# Patient Record
Sex: Female | Born: 1945
Health system: Southern US, Community
[De-identification: ages and names within clinical notes are randomized; demographics above are authoritative.]

## PROBLEM LIST (undated history)

## (undated) DIAGNOSIS — K219 Gastro-esophageal reflux disease without esophagitis: Secondary | ICD-10-CM

## (undated) DIAGNOSIS — E079 Disorder of thyroid, unspecified: Secondary | ICD-10-CM

## (undated) DIAGNOSIS — S82899A Other fracture of unspecified lower leg, initial encounter for closed fracture: Secondary | ICD-10-CM

## (undated) DIAGNOSIS — H269 Unspecified cataract: Secondary | ICD-10-CM

## (undated) DIAGNOSIS — M7731 Calcaneal spur, right foot: Secondary | ICD-10-CM

## (undated) DIAGNOSIS — E559 Vitamin D deficiency, unspecified: Secondary | ICD-10-CM

## (undated) DIAGNOSIS — M8580 Other specified disorders of bone density and structure, unspecified site: Secondary | ICD-10-CM

## (undated) HISTORY — DX: Vitamin D deficiency, unspecified: E55.9

## (undated) HISTORY — DX: Calcaneal spur, right foot: M77.31

## (undated) HISTORY — DX: Gastro-esophageal reflux disease without esophagitis: K21.9

## (undated) HISTORY — DX: Disorder of thyroid, unspecified: E07.9

## (undated) HISTORY — PX: ABDOMINAL HYSTERECTOMY: SHX81

## (undated) HISTORY — DX: Unspecified cataract: H26.9

## (undated) HISTORY — PX: CATARACT EXTRACTION, BILATERAL: SHX1313

## (undated) HISTORY — DX: Other fracture of unspecified lower leg, initial encounter for closed fracture: S82.899A

## (undated) HISTORY — DX: Other specified disorders of bone density and structure, unspecified site: M85.80

## (undated) HISTORY — PX: SPINE SURGERY: SHX786

---

## 2005-03-29 ENCOUNTER — Ambulatory Visit: Payer: Self-pay | Admitting: Family Medicine

## 2005-04-01 ENCOUNTER — Ambulatory Visit: Payer: Self-pay | Admitting: Family Medicine

## 2005-04-04 ENCOUNTER — Ambulatory Visit: Payer: Self-pay | Admitting: Family Medicine

## 2005-04-08 ENCOUNTER — Ambulatory Visit: Payer: Self-pay | Admitting: Family Medicine

## 2005-04-12 ENCOUNTER — Ambulatory Visit: Payer: Self-pay | Admitting: Family Medicine

## 2005-04-23 ENCOUNTER — Ambulatory Visit: Payer: Self-pay | Admitting: Family Medicine

## 2016-10-02 DIAGNOSIS — H2513 Age-related nuclear cataract, bilateral: Secondary | ICD-10-CM | POA: Diagnosis not present

## 2016-10-08 DIAGNOSIS — H2513 Age-related nuclear cataract, bilateral: Secondary | ICD-10-CM | POA: Diagnosis not present

## 2016-11-07 DIAGNOSIS — Z961 Presence of intraocular lens: Secondary | ICD-10-CM | POA: Diagnosis not present

## 2016-11-07 DIAGNOSIS — H2512 Age-related nuclear cataract, left eye: Secondary | ICD-10-CM | POA: Diagnosis not present

## 2016-11-07 DIAGNOSIS — H2511 Age-related nuclear cataract, right eye: Secondary | ICD-10-CM | POA: Diagnosis not present

## 2017-02-19 DIAGNOSIS — H2512 Age-related nuclear cataract, left eye: Secondary | ICD-10-CM | POA: Diagnosis not present

## 2017-02-19 DIAGNOSIS — H04123 Dry eye syndrome of bilateral lacrimal glands: Secondary | ICD-10-CM | POA: Diagnosis not present

## 2017-02-19 DIAGNOSIS — Z961 Presence of intraocular lens: Secondary | ICD-10-CM | POA: Diagnosis not present

## 2017-02-27 DIAGNOSIS — H2512 Age-related nuclear cataract, left eye: Secondary | ICD-10-CM | POA: Diagnosis not present

## 2017-02-27 DIAGNOSIS — Z961 Presence of intraocular lens: Secondary | ICD-10-CM | POA: Diagnosis not present

## 2017-02-27 DIAGNOSIS — H20012 Primary iridocyclitis, left eye: Secondary | ICD-10-CM | POA: Diagnosis not present

## 2017-02-27 DIAGNOSIS — H18232 Secondary corneal edema, left eye: Secondary | ICD-10-CM | POA: Diagnosis not present

## 2017-04-01 DIAGNOSIS — H59022 Cataract (lens) fragments in eye following cataract surgery, left eye: Secondary | ICD-10-CM | POA: Diagnosis not present

## 2017-05-06 DIAGNOSIS — E785 Hyperlipidemia, unspecified: Secondary | ICD-10-CM | POA: Insufficient documentation

## 2017-05-06 DIAGNOSIS — E039 Hypothyroidism, unspecified: Secondary | ICD-10-CM | POA: Insufficient documentation

## 2017-05-06 DIAGNOSIS — R319 Hematuria, unspecified: Secondary | ICD-10-CM | POA: Diagnosis not present

## 2017-05-06 DIAGNOSIS — N390 Urinary tract infection, site not specified: Secondary | ICD-10-CM | POA: Diagnosis not present

## 2017-05-06 DIAGNOSIS — E559 Vitamin D deficiency, unspecified: Secondary | ICD-10-CM | POA: Diagnosis not present

## 2017-05-06 DIAGNOSIS — E669 Obesity, unspecified: Secondary | ICD-10-CM | POA: Diagnosis not present

## 2017-05-07 DIAGNOSIS — E039 Hypothyroidism, unspecified: Secondary | ICD-10-CM | POA: Diagnosis not present

## 2017-05-07 DIAGNOSIS — E559 Vitamin D deficiency, unspecified: Secondary | ICD-10-CM | POA: Diagnosis not present

## 2017-05-07 DIAGNOSIS — E785 Hyperlipidemia, unspecified: Secondary | ICD-10-CM | POA: Diagnosis not present

## 2017-05-07 DIAGNOSIS — E669 Obesity, unspecified: Secondary | ICD-10-CM | POA: Diagnosis not present

## 2017-06-13 DIAGNOSIS — H04123 Dry eye syndrome of bilateral lacrimal glands: Secondary | ICD-10-CM | POA: Diagnosis not present

## 2017-07-07 DIAGNOSIS — E559 Vitamin D deficiency, unspecified: Secondary | ICD-10-CM | POA: Diagnosis not present

## 2017-07-07 DIAGNOSIS — E039 Hypothyroidism, unspecified: Secondary | ICD-10-CM | POA: Diagnosis not present

## 2017-07-23 DIAGNOSIS — R87610 Atypical squamous cells of undetermined significance on cytologic smear of cervix (ASC-US): Secondary | ICD-10-CM | POA: Diagnosis not present

## 2017-07-23 DIAGNOSIS — B373 Candidiasis of vulva and vagina: Secondary | ICD-10-CM | POA: Diagnosis not present

## 2017-07-23 DIAGNOSIS — N898 Other specified noninflammatory disorders of vagina: Secondary | ICD-10-CM | POA: Diagnosis not present

## 2017-09-18 DIAGNOSIS — H04123 Dry eye syndrome of bilateral lacrimal glands: Secondary | ICD-10-CM | POA: Diagnosis not present

## 2017-10-14 DIAGNOSIS — E785 Hyperlipidemia, unspecified: Secondary | ICD-10-CM | POA: Diagnosis not present

## 2017-10-14 DIAGNOSIS — R5383 Other fatigue: Secondary | ICD-10-CM | POA: Diagnosis not present

## 2017-10-14 DIAGNOSIS — R799 Abnormal finding of blood chemistry, unspecified: Secondary | ICD-10-CM | POA: Diagnosis not present

## 2017-10-14 DIAGNOSIS — E119 Type 2 diabetes mellitus without complications: Secondary | ICD-10-CM | POA: Diagnosis not present

## 2017-10-20 DIAGNOSIS — E039 Hypothyroidism, unspecified: Secondary | ICD-10-CM | POA: Diagnosis not present

## 2018-01-01 DIAGNOSIS — E039 Hypothyroidism, unspecified: Secondary | ICD-10-CM | POA: Diagnosis not present

## 2018-03-04 DIAGNOSIS — E039 Hypothyroidism, unspecified: Secondary | ICD-10-CM | POA: Diagnosis not present

## 2018-03-25 DIAGNOSIS — N390 Urinary tract infection, site not specified: Secondary | ICD-10-CM | POA: Diagnosis not present

## 2018-03-25 DIAGNOSIS — R350 Frequency of micturition: Secondary | ICD-10-CM | POA: Diagnosis not present

## 2018-03-25 DIAGNOSIS — Z1231 Encounter for screening mammogram for malignant neoplasm of breast: Secondary | ICD-10-CM | POA: Diagnosis not present

## 2018-03-25 DIAGNOSIS — R319 Hematuria, unspecified: Secondary | ICD-10-CM | POA: Diagnosis not present

## 2018-09-10 DIAGNOSIS — H6500 Acute serous otitis media, unspecified ear: Secondary | ICD-10-CM | POA: Diagnosis not present

## 2018-10-01 DIAGNOSIS — H04123 Dry eye syndrome of bilateral lacrimal glands: Secondary | ICD-10-CM | POA: Diagnosis not present

## 2019-06-09 DIAGNOSIS — E559 Vitamin D deficiency, unspecified: Secondary | ICD-10-CM | POA: Diagnosis not present

## 2019-06-09 DIAGNOSIS — E039 Hypothyroidism, unspecified: Secondary | ICD-10-CM | POA: Diagnosis not present

## 2019-06-09 DIAGNOSIS — E669 Obesity, unspecified: Secondary | ICD-10-CM | POA: Diagnosis not present

## 2019-06-09 DIAGNOSIS — E785 Hyperlipidemia, unspecified: Secondary | ICD-10-CM | POA: Diagnosis not present

## 2019-06-10 DIAGNOSIS — E039 Hypothyroidism, unspecified: Secondary | ICD-10-CM | POA: Diagnosis not present

## 2019-06-10 DIAGNOSIS — E669 Obesity, unspecified: Secondary | ICD-10-CM | POA: Diagnosis not present

## 2019-06-10 DIAGNOSIS — H9203 Otalgia, bilateral: Secondary | ICD-10-CM | POA: Diagnosis not present

## 2019-06-10 DIAGNOSIS — K219 Gastro-esophageal reflux disease without esophagitis: Secondary | ICD-10-CM | POA: Insufficient documentation

## 2019-06-10 DIAGNOSIS — E785 Hyperlipidemia, unspecified: Secondary | ICD-10-CM | POA: Diagnosis not present

## 2019-06-10 DIAGNOSIS — R590 Localized enlarged lymph nodes: Secondary | ICD-10-CM | POA: Diagnosis not present

## 2019-06-10 DIAGNOSIS — E559 Vitamin D deficiency, unspecified: Secondary | ICD-10-CM | POA: Diagnosis not present

## 2019-11-02 ENCOUNTER — Encounter: Payer: Self-pay | Admitting: Physician Assistant

## 2019-11-02 ENCOUNTER — Other Ambulatory Visit: Payer: Self-pay

## 2019-11-02 ENCOUNTER — Ambulatory Visit (INDEPENDENT_AMBULATORY_CARE_PROVIDER_SITE_OTHER): Payer: Medicare HMO | Admitting: Physician Assistant

## 2019-11-02 VITALS — BP 116/72 | HR 73 | Temp 98.7°F | Ht 64.0 in | Wt 184.4 lb

## 2019-11-02 DIAGNOSIS — L659 Nonscarring hair loss, unspecified: Secondary | ICD-10-CM | POA: Diagnosis not present

## 2019-11-02 DIAGNOSIS — E039 Hypothyroidism, unspecified: Secondary | ICD-10-CM

## 2019-11-02 DIAGNOSIS — E785 Hyperlipidemia, unspecified: Secondary | ICD-10-CM | POA: Diagnosis not present

## 2019-11-02 DIAGNOSIS — Z0000 Encounter for general adult medical examination without abnormal findings: Secondary | ICD-10-CM | POA: Diagnosis not present

## 2019-11-02 DIAGNOSIS — K219 Gastro-esophageal reflux disease without esophagitis: Secondary | ICD-10-CM

## 2019-11-02 DIAGNOSIS — M67971 Unspecified disorder of synovium and tendon, right ankle and foot: Secondary | ICD-10-CM | POA: Diagnosis not present

## 2019-11-02 DIAGNOSIS — E559 Vitamin D deficiency, unspecified: Secondary | ICD-10-CM | POA: Diagnosis not present

## 2019-11-02 MED ORDER — OMEPRAZOLE 10 MG PO CPDR: 10.0000 mg | DELAYED_RELEASE_CAPSULE | Freq: Every day | ORAL | 3 refills | Status: DC

## 2019-11-02 MED ORDER — VITAMIN D (ERGOCALCIFEROL) 1.25 MG (50000 UNIT) PO CAPS: 50000.0000 [IU] | ORAL_CAPSULE | ORAL | 11 refills | Status: DC

## 2019-11-02 MED ORDER — EUTHYROX 88 MCG PO TABS: 88.0000 ug | ORAL_TABLET | Freq: Every day | ORAL | 1 refills | Status: DC

## 2019-11-03 LAB — CMP14+EGFR
ALT: 14 IU/L (ref 0–32)
AST: 21 IU/L (ref 0–40)
Albumin/Globulin Ratio: 1.7 (ref 1.2–2.2)
Albumin: 4.1 g/dL (ref 3.7–4.7)
Alkaline Phosphatase: 99 IU/L (ref 39–117)
BUN/Creatinine Ratio: 17 (ref 12–28)
BUN: 13 mg/dL (ref 8–27)
Bilirubin Total: 0.3 mg/dL (ref 0.0–1.2)
CO2: 23 mmol/L (ref 20–29)
Calcium: 9.6 mg/dL (ref 8.7–10.3)
Chloride: 106 mmol/L (ref 96–106)
Creatinine, Ser: 0.77 mg/dL (ref 0.57–1.00)
GFR calc Af Amer: 89 mL/min/{1.73_m2} (ref >59)
GFR calc non Af Amer: 77 mL/min/{1.73_m2} (ref >59)
Globulin, Total: 2.4 g/dL (ref 1.5–4.5)
Glucose: 87 mg/dL (ref 65–99)
Potassium: 4 mmol/L (ref 3.5–5.2)
Sodium: 144 mmol/L (ref 134–144)
Total Protein: 6.5 g/dL (ref 6.0–8.5)

## 2019-11-03 LAB — CBC WITH DIFFERENTIAL/PLATELET
Basophils Absolute: 0.1 10*3/uL (ref 0.0–0.2)
Basos: 1 %
EOS (ABSOLUTE): 0.2 10*3/uL (ref 0.0–0.4)
Eos: 2 %
Hematocrit: 41 % (ref 34.0–46.6)
Hemoglobin: 13.7 g/dL (ref 11.1–15.9)
Immature Grans (Abs): 0 10*3/uL (ref 0.0–0.1)
Immature Granulocytes: 0 %
Lymphocytes Absolute: 2.2 10*3/uL (ref 0.7–3.1)
Lymphs: 27 %
MCH: 30.7 pg (ref 26.6–33.0)
MCHC: 33.4 g/dL (ref 31.5–35.7)
MCV: 92 fL (ref 79–97)
Monocytes Absolute: 0.5 10*3/uL (ref 0.1–0.9)
Monocytes: 7 %
Neutrophils Absolute: 5 10*3/uL (ref 1.4–7.0)
Neutrophils: 63 %
Platelets: 198 10*3/uL (ref 150–450)
RBC: 4.46 x10E6/uL (ref 3.77–5.28)
RDW: 12.3 % (ref 11.7–15.4)
WBC: 7.9 10*3/uL (ref 3.4–10.8)

## 2019-11-03 LAB — LIPID PANEL
Chol/HDL Ratio: 4.2 ratio (ref 0.0–4.4)
Cholesterol, Total: 156 mg/dL (ref 100–199)
HDL: 37 mg/dL — ABNORMAL LOW (ref >39)
LDL Chol Calc (NIH): 91 mg/dL (ref 0–99)
Triglycerides: 157 mg/dL — ABNORMAL HIGH (ref 0–149)
VLDL Cholesterol Cal: 28 mg/dL (ref 5–40)

## 2019-11-03 LAB — THYROID PANEL WITH TSH
Free Thyroxine Index: 2.2 (ref 1.2–4.9)
T3 Uptake Ratio: 27 % (ref 24–39)
T4, Total: 8.3 ug/dL (ref 4.5–12.0)
TSH: 2.41 u[IU]/mL (ref 0.450–4.500)

## 2019-11-03 LAB — FSH/LH
FSH: 48.6 m[IU]/mL
LH: 18.3 m[IU]/mL

## 2019-11-04 ENCOUNTER — Telehealth: Payer: Self-pay | Admitting: Physician Assistant

## 2019-11-04 NOTE — Telephone Encounter (Signed)
Patient aware of results.

## 2019-11-07 NOTE — Progress Notes (Signed)
BP 116/72   Pulse 73   Temp 98.7 F (37.1 C) (Temporal)   Ht _0  (1.626 m)   Wt 184 lb 6.4 oz (83.6 kg)   SpO2 97%   BMI 31.65 kg/m    Subjective:    Patient ID: Diamond Wright, female    DOB: 10-30-1946, 73 y.o.   MRN: [id number]  HPI: Diamond Wright is a 73 y.o. female presenting on 11/02/2019 for New Patient (Initial Visit) This patient comes in to establish as a new visit.  Her past medical history is positive for GERD, hypothyroidism, dyslipidemia, vitamin D deficiency.  She did have an old history of an ankle fracture.  Currently she still is having a little bit issue with her Achilles.  It will hurt when she tries to walk and move.  It has a tearing sensation in the back.  She has not heard it pop or crack or go limp on her when she is walking.  She denies any new injury at this time.  She is currently on her thyroid replacement, omeprazole, vitamin D deficiency, and aspirin 81 mg.  I suggested that she try a muscle cream or something like Voltaren over-the-counter for her ankle pain.  And she will try to get that. She does report that she is having some increase in hair loss.  It is global.  There are no patches of baldness.  Her hairdresser had noticed a difference today.  We will update labs to see if there is anything going on there.   Past Medical History:  Diagnosis Date  . Ankle fracture   . GERD (gastroesophageal reflux disease)   . Thyroid disease   . Vitamin D deficiency    Relevant past medical, surgical, family and social history reviewed and updated as indicated. Interim medical history since our last visit reviewed. Allergies and medications reviewed and updated. DATA REVIEWED: CHART IN EPIC  Family History reviewed for pertinent findings.  Review of Systems  Constitutional: Negative.  Negative for activity change, fatigue and fever.  HENT: Negative.   Eyes: Negative.   Respiratory: Negative.  Negative for cough.   Cardiovascular: Negative.  Negative  for chest pain.  Gastrointestinal: Negative.  Negative for abdominal pain.  Endocrine: Negative.   Genitourinary: Negative.  Negative for dysuria.  Musculoskeletal: Negative.   Skin: Negative.   Neurological: Negative.     Allergies as of 11/02/2019      Reactions   Naproxen Other (See Comments)   Irritates bladder      Medication List       Accurate as of November 02, 2019 11:59 PM. If you have any questions, ask your nurse or doctor.        aspirin EC 81 MG tablet Take by mouth. 2-3 a week   Euthyrox 88 MCG tablet Generic drug: levothyroxine Take 1 tablet (88 mcg total) by mouth daily.   omeprazole 10 MG capsule Commonly known as: PRILOSEC Take 1 capsule (10 mg total) by mouth daily. As needed   Vitamin D (Ergocalciferol) 1.25 MG (50000 UT) Caps capsule Commonly known as: DRISDOL Take 1 capsule (50,000 Units total) by mouth once a week.          Objective:    BP 116/72   Pulse 73   Temp 98.7 F (37.1 C) (Temporal)   Ht _1  (1.626 m)   Wt 184 lb 6.4 oz (83.6 kg)   SpO2 97%   BMI 31.65 kg/m   Allergies  Allergen Reactions  . Naproxen Other (See Comments)    Irritates bladder    Wt Readings from Last 3 Encounters:  11/02/19 184 lb 6.4 oz (83.6 kg)    Physical Exam Constitutional:      General: She is not in acute distress.    Appearance: Normal appearance. She is well-developed.  HENT:     Head: Normocephalic and atraumatic.  Cardiovascular:     Rate and Rhythm: Normal rate.  Pulmonary:     Effort: Pulmonary effort is normal.  Skin:    General: Skin is warm and dry.     Findings: No rash.  Neurological:     Mental Status: She is alert and oriented to person, place, and time.     Deep Tendon Reflexes: Reflexes are normal and symmetric.     Results for orders placed or performed in visit on 11/02/19  CBC with Differential/Platelet  Result Value Ref Range   WBC 7.9 3.4 - 10.8 x10E3/uL   RBC 4.46 3.77 - 5.28 x10E6/uL   Hemoglobin 13.7  11.1 - 15.9 g/dL   Hematocrit 41.0 34.0 - 46.6 %   MCV 92 79 - 97 fL   MCH 30.7 26.6 - 33.0 pg   MCHC 33.4 31.5 - 35.7 g/dL   RDW 12.3 11.7 - 15.4 %   Platelets 198 150 - 450 x10E3/uL   Neutrophils 63 Not Estab. %   Lymphs 27 Not Estab. %   Monocytes 7 Not Estab. %   Eos 2 Not Estab. %   Basos 1 Not Estab. %   Neutrophils Absolute 5.0 1.4 - 7.0 x10E3/uL   Lymphocytes Absolute 2.2 0.7 - 3.1 x10E3/uL   Monocytes Absolute 0.5 0.1 - 0.9 x10E3/uL   EOS (ABSOLUTE) 0.2 0.0 - 0.4 x10E3/uL   Basophils Absolute 0.1 0.0 - 0.2 x10E3/uL   Immature Granulocytes 0 Not Estab. %   Immature Grans (Abs) 0.0 0.0 - 0.1 x10E3/uL  CMP14+EGFR  Result Value Ref Range   Glucose 87 65 - 99 mg/dL   BUN 13 8 - 27 mg/dL   Creatinine, Ser 0.77 0.57 - 1.00 mg/dL   GFR calc non Af Amer 77 >59 mL/min/1.73   GFR calc Af Amer 89 >59 mL/min/1.73   BUN/Creatinine Ratio 17 12 - 28   Sodium 144 134 - 144 mmol/L   Potassium 4.0 3.5 - 5.2 mmol/L   Chloride 106 96 - 106 mmol/L   CO2 23 20 - 29 mmol/L   Calcium 9.6 8.7 - 10.3 mg/dL   Total Protein 6.5 6.0 - 8.5 g/dL   Albumin 4.1 3.7 - 4.7 g/dL   Globulin, Total 2.4 1.5 - 4.5 g/dL   Albumin/Globulin Ratio 1.7 1.2 - 2.2   Bilirubin Total 0.3 0.0 - 1.2 mg/dL   Alkaline Phosphatase 99 39 - 117 IU/L   AST 21 0 - 40 IU/L   ALT 14 0 - 32 IU/L  Lipid panel  Result Value Ref Range   Cholesterol, Total 156 100 - 199 mg/dL   Triglycerides 157 (H) 0 - 149 mg/dL   HDL 37 (L) >39 mg/dL   VLDL Cholesterol Cal 28 5 - 40 mg/dL   LDL Chol Calc (NIH) 91 0 - 99 mg/dL   Chol/HDL Ratio 4.2 0.0 - 4.4 ratio  Thyroid Panel With TSH  Result Value Ref Range   TSH 2.410 0.450 - 4.500 uIU/mL   T4, Total 8.3 4.5 - 12.0 ug/dL   T3 Uptake Ratio 27 24 - 39 %  Free Thyroxine Index 2.2 1.2 - 4.9  FSH/LH  Result Value Ref Range   LH 18.3 mIU/mL   FSH 48.6 mIU/mL      Assessment & Plan:   1. Achilles tendon disorder, right Wrist and gentle stretching Voltaren topical  2.  Gastroesophageal reflux disease without esophagiti - CBC with Differential/Platelet  3. Hypothyroidism, unspecified type - Thyroid Panel With TSH - FSH/LH  4. Dyslipidemia - Lipid panel  5. Vitamin D deficiency - Vitamin D, Ergocalciferol, (DRISDOL) 1.25 MG (50000 UT) CAPS capsule; Take 1 capsule (50,000 Units total) by mouth once a week.  Dispense: 5 capsule; Refill: 11  6. Hair loss - CBC with Differential/Platelet - CMP14+EGFR - Lipid panel - Thyroid Panel With TSH - FSH/LH  7. Well adult exam - CBC with Differential/Platelet - CMP14+EGFR - Lipid panel - Thyroid Panel With TSH - FSH/LH   Continue all other maintenance medications as listed above.  Follow up plan: Return in about 3 months (around 02/02/2020).  Educational handout given for [place] PA-C [facility] [address]  [place], [place] [address] [phone]   11/07/2019, 4:10 PM

## 2019-11-30 ENCOUNTER — Other Ambulatory Visit: Payer: Self-pay | Admitting: Family Medicine

## 2019-11-30 DIAGNOSIS — Z1231 Encounter for screening mammogram for malignant neoplasm of breast: Secondary | ICD-10-CM

## 2020-01-03 ENCOUNTER — Telehealth: Payer: Self-pay | Admitting: Physician Assistant

## 2020-01-03 ENCOUNTER — Other Ambulatory Visit: Payer: Self-pay | Admitting: Physician Assistant

## 2020-01-03 DIAGNOSIS — M67971 Unspecified disorder of synovium and tendon, right ankle and foot: Secondary | ICD-10-CM

## 2020-01-03 NOTE — Telephone Encounter (Signed)
Aware and verbalizes understanding.  

## 2020-01-03 NOTE — Progress Notes (Signed)
podia

## 2020-01-03 NOTE — Telephone Encounter (Signed)
REFERRAL REQUEST Telephone Note  What type of referral do you need? Foot doctor for foot pain  Have you been seen at our office for this problem? Yes was told by Lawanna Kobus to do some exercises and they are not helping (Advise that they will likely need an appointment with their PCP before a referral can be done)  Is there a particular doctor or location that you prefer? Dr. Ulice Brilliant  Patient notified that referrals can take up to a week or longer to process. If they haven't heard anything within a week they should call back and speak with the referral department.

## 2020-01-03 NOTE — Telephone Encounter (Signed)
Order is placed.

## 2020-01-04 ENCOUNTER — Telehealth: Payer: Self-pay | Admitting: Physician Assistant

## 2020-01-20 DIAGNOSIS — M79671 Pain in right foot: Secondary | ICD-10-CM | POA: Diagnosis not present

## 2020-01-20 DIAGNOSIS — M7731 Calcaneal spur, right foot: Secondary | ICD-10-CM | POA: Diagnosis not present

## 2020-02-02 ENCOUNTER — Ambulatory Visit: Payer: Medicare HMO | Admitting: Physician Assistant

## 2020-02-08 ENCOUNTER — Telehealth: Payer: Self-pay | Admitting: Physician Assistant

## 2020-02-09 ENCOUNTER — Ambulatory Visit (INDEPENDENT_AMBULATORY_CARE_PROVIDER_SITE_OTHER): Payer: Medicare HMO | Admitting: Physician Assistant

## 2020-02-09 ENCOUNTER — Encounter: Payer: Self-pay | Admitting: Physician Assistant

## 2020-02-09 ENCOUNTER — Other Ambulatory Visit: Payer: Self-pay

## 2020-02-09 VITALS — BP 119/74 | HR 78 | Temp 97.5°F | Ht 64.0 in | Wt 187.1 lb

## 2020-02-09 DIAGNOSIS — E039 Hypothyroidism, unspecified: Secondary | ICD-10-CM

## 2020-02-09 DIAGNOSIS — M8580 Other specified disorders of bone density and structure, unspecified site: Secondary | ICD-10-CM

## 2020-02-09 DIAGNOSIS — M67971 Unspecified disorder of synovium and tendon, right ankle and foot: Secondary | ICD-10-CM | POA: Diagnosis not present

## 2020-02-09 MED ORDER — EUTHYROX 88 MCG PO TABS: 88.0000 ug | ORAL_TABLET | Freq: Every day | ORAL | 1 refills | Status: DC

## 2020-02-13 NOTE — Progress Notes (Signed)
Acute Office Visit  Subjective:    Patient ID: Diamond Wright, female    DOB: 14-Aug-1946, 74 y.o.   MRN: [id number]  Chief Complaint  Patient presents with  . Medical Management of Chronic Issues    30m reck. Request refills of Levothyroxine  . Gastroesophageal Reflux  . Thyroid Problem    Gastroesophageal Reflux She complains of chest pain and heartburn. She reports no abdominal pain or no coughing. This is a chronic problem. The current episode started more than 1 year ago. The heartburn duration is several minutes. The heartburn is of mild intensity. Associated symptoms include fatigue. Pertinent negatives include no weight loss. She has tried a PPI for the symptoms. The treatment provided mild relief.  Thyroid Problem Presents for follow-up visit. Symptoms include anxiety, cold intolerance, fatigue and hair loss. Patient reports no weight gain or weight loss. The symptoms have been improving.     Past Medical History:  Diagnosis Date  . Ankle fracture    Bilateral  . GERD (gastroesophageal reflux disease)   . Thyroid disease   . Vitamin D deficiency     Past Surgical History:  Procedure Laterality Date  . ABDOMINAL HYSTERECTOMY    . CATARACT EXTRACTION, BILATERAL    . SPINE SURGERY      Family History  Problem Relation Age of Onset  . Seizures Mother   . Congestive Heart Failure Mother   . Diabetes Father   . Heart disease Father   . Lupus Daughter   . Ulcerative colitis Son     Social History   Socioeconomic History  . Marital status: Married    Spouse name: Not on file  . Number of children: 4  . Years of education: Not on file  . Highest education level: Not on file  Occupational History  . Not on file  Tobacco Use  . Smoking status: Never Smoker  . Smokeless tobacco: Never Used  Substance and Sexual Activity  . Alcohol use: Never  . Drug use: Never  . Sexual activity: Not on file  Other Topics Concern  . Not on file  Social History Narrative    . Not on file   Social Determinants of Health   Financial Resource Strain:   . Difficulty of Paying Living Expenses: Not on file  Food Insecurity:   . Worried About Charity fundraiser in the Last Year: Not on file  . Ran Out of Food in the Last Year: Not on file  Transportation Needs:   . Lack of Transportation (Medical): Not on file  . Lack of Transportation (Non-Medical): Not on file  Physical Activity:   . Days of Exercise per Week: Not on file  . Minutes of Exercise per Session: Not on file  Stress:   . Feeling of Stress : Not on file  Social Connections:   . Frequency of Communication with Friends and Family: Not on file  . Frequency of Social Gatherings with Friends and Family: Not on file  . Attends Religious Services: Not on file  . Active Member of Clubs or Organizations: Not on file  . Attends Archivist Meetings: Not on file  . Marital Status: Not on file  Intimate Partner Violence:   . Fear of Current or Ex-Partner: Not on file  . Emotionally Abused: Not on file  . Physically Abused: Not on file  . Sexually Abused: Not on file    Outpatient Medications Prior to Visit  Medication Sig Dispense Refill  .  aspirin EC 81 MG tablet Take by mouth. 2-3 a week    . meloxicam (MOBIC) 15 MG tablet Take 15 mg by mouth daily.    Marland Kitchen omeprazole (PRILOSEC) 10 MG capsule Take 1 capsule (10 mg total) by mouth daily. As needed 90 capsule 3  . Vitamin D, Ergocalciferol, (DRISDOL) 1.25 MG (50000 UT) CAPS capsule Take 1 capsule (50,000 Units total) by mouth once a week. 5 capsule 11  . EUTHYROX 88 MCG tablet Take 1 tablet (88 mcg total) by mouth daily. 90 tablet 1   No facility-administered medications prior to visit.    Allergies  Allergen Reactions  . Naproxen Other (See Comments)    Irritates bladder    Review of Systems  Constitutional: Positive for fatigue. Negative for activity change, fever, weight gain and weight loss.  HENT: Negative.   Eyes: Negative.    Respiratory: Negative.  Negative for cough.   Cardiovascular: Positive for chest pain.  Gastrointestinal: Positive for heartburn. Negative for abdominal pain.  Endocrine: Positive for cold intolerance.  Genitourinary: Negative.  Negative for dysuria.  Musculoskeletal: Negative.   Neurological: Negative.   Psychiatric/Behavioral: The patient is nervous/anxious.        Objective:    Physical Exam Constitutional:      General: She is not in acute distress.    Appearance: Normal appearance. She is well-developed.  HENT:     Head: Normocephalic and atraumatic.  Cardiovascular:     Rate and Rhythm: Normal rate.  Pulmonary:     Effort: Pulmonary effort is normal.  Skin:    General: Skin is warm and dry.     Findings: No rash.  Neurological:     Mental Status: She is alert and oriented to person, place, and time.     Deep Tendon Reflexes: Reflexes are normal and symmetric.     BP 119/74   Pulse 78   Temp (!) 97.5 F (36.4 C)   Ht 5\' 4"  (1.626 m)   Wt 187 lb 2 oz (84.9 kg)   SpO2 97%   BMI 32.12 kg/m  Wt Readings from Last 3 Encounters:  02/09/20 187 lb 2 oz (84.9 kg)  11/02/19 184 lb 6.4 oz (83.6 kg)    Health Maintenance Due  Topic Date Due  . Hepatitis C Screening  August 27, 1946  . URINE MICROALBUMIN  02/04/1957  . MAMMOGRAM  02/04/1997  . DEXA SCAN  02/05/2012    There are no preventive care reminders to display for this patient.   Lab Results  Component Value Date   TSH 2.410 11/02/2019   Lab Results  Component Value Date   WBC 7.9 11/02/2019   HGB 13.7 11/02/2019   HCT 41.0 11/02/2019   MCV 92 11/02/2019   PLT 198 11/02/2019   Lab Results  Component Value Date   NA 144 11/02/2019   K 4.0 11/02/2019   CO2 23 11/02/2019   GLUCOSE 87 11/02/2019   BUN 13 11/02/2019   CREATININE 0.77 11/02/2019   BILITOT 0.3 11/02/2019   ALKPHOS 99 11/02/2019   AST 21 11/02/2019   ALT 14 11/02/2019   PROT 6.5 11/02/2019   ALBUMIN 4.1 11/02/2019   CALCIUM 9.6  11/02/2019   Lab Results  Component Value Date   CHOL 156 11/02/2019   Lab Results  Component Value Date   HDL 37 (L) 11/02/2019   Lab Results  Component Value Date   LDLCALC 91 11/02/2019   Lab Results  Component Value Date   TRIG 157 (  H) 11/02/2019   Lab Results  Component Value Date   CHOLHDL 4.2 11/02/2019   No results found for: HGBA1C     Assessment & Plan:   Problem List Items Addressed This Visit      Endocrine   Hypothyroidism - Primary   Relevant Medications   EUTHYROX 88 MCG tablet    Other Visit Diagnoses    Achilles tendon disorder, right       Relevant Medications   meloxicam (MOBIC) 15 MG tablet       Meds ordered this encounter  Medications  . EUTHYROX 88 MCG tablet    Sig: Take 1 tablet (88 mcg total) by mouth daily.    Dispense:  90 tablet    Refill:  1    Order Specific Question:   Supervising Provider    Answer:   Raliegh Ip [[id number]]     Remus Loffler, PA-C

## 2020-02-21 ENCOUNTER — Other Ambulatory Visit: Payer: Self-pay

## 2020-02-22 ENCOUNTER — Ambulatory Visit (INDEPENDENT_AMBULATORY_CARE_PROVIDER_SITE_OTHER): Payer: Medicare HMO

## 2020-02-22 DIAGNOSIS — M8588 Other specified disorders of bone density and structure, other site: Secondary | ICD-10-CM

## 2020-02-22 DIAGNOSIS — M8580 Other specified disorders of bone density and structure, unspecified site: Secondary | ICD-10-CM

## 2020-02-22 DIAGNOSIS — M8589 Other specified disorders of bone density and structure, multiple sites: Secondary | ICD-10-CM | POA: Diagnosis not present

## 2020-02-22 DIAGNOSIS — Z780 Asymptomatic menopausal state: Secondary | ICD-10-CM | POA: Diagnosis not present

## 2020-02-24 DIAGNOSIS — M79671 Pain in right foot: Secondary | ICD-10-CM | POA: Diagnosis not present

## 2020-02-24 DIAGNOSIS — M7731 Calcaneal spur, right foot: Secondary | ICD-10-CM | POA: Diagnosis not present

## 2020-03-14 ENCOUNTER — Ambulatory Visit (INDEPENDENT_AMBULATORY_CARE_PROVIDER_SITE_OTHER): Payer: Medicare HMO | Admitting: *Deleted

## 2020-03-14 DIAGNOSIS — Z0000 Encounter for general adult medical examination without abnormal findings: Secondary | ICD-10-CM | POA: Diagnosis not present

## 2020-03-14 NOTE — Progress Notes (Addendum)
MEDICARE ANNUAL WELLNESS VISIT  03/14/2020  Telephone Visit Disclaimer This Medicare AWV was conducted by telephone due to national recommendations for restrictions regarding the COVID-19 Pandemic (e.g. social distancing).  I verified, using two identifiers, that I am speaking with Diamond Wright or their authorized healthcare agent. I discussed the limitations, risks, security, and privacy concerns of performing an evaluation and management service by telephone and the potential availability of an in-person appointment in the future. The patient expressed understanding and agreed to proceed.   Subjective:  Diamond Wright is a 74 y.o. female patient of Terald Sleeper, PA-C who had a Medicare Annual Wellness Visit today via telephone. [name] is Retired and lives with their spouse. She has 4 children. She reports that she is socially active and does interact with friends/family regularly. She is minimally physically active and enjoys gardening and listening to [employer].  Patient Care Team: Theodoro Clock as PCP - General (Physician Assistant)  Advanced Directives 03/14/2020  Does Patient Have a Medical Advance Directive? No  Would patient like information on creating a medical advance directive? No - Patient declined    Hospital Utilization Over the Past 12 Months: # of hospitalizations or ER visits: 0 # of surgeries: 0  Review of Systems    Patient reports that her overall health is worse compared to last year due to heel spur.  History obtained from chart review and the patient Musculoskeletal ROS: positive for - pain in right heel from heel spur.  Patient Reported Readings (BP, Pulse, CBG, Weight, etc) none  Pain Assessment Pain : 0-10 Pain Score: 5  Pain Type: Acute pain Pain Location: Heel Pain Orientation: Right Pain Descriptors / Indicators: Aching, Burning Pain Onset: More than a month ago Pain Frequency: Intermittent Pain Relieving Factors: Meloxicam  15mg  Effect of Pain on Daily Activities: She is able to do what she has to do.  Pain Relieving Factors: Meloxicam 15mg   Current Medications & Allergies (verified) Allergies as of 03/14/2020       Reactions   Naproxen Other (See Comments)   Irritates bladder        Medication List        Accurate as of March 14, 2020  1:51 PM. If you have any questions, ask your nurse or doctor.          aspirin EC 81 MG tablet Take by mouth. 2-3 a week   Euthyrox 88 MCG tablet Generic drug: levothyroxine Take 1 tablet (88 mcg total) by mouth daily.   Hair Skin and Nails Formula Tabs Take by mouth.   Magnesium 200 MG Tabs Take by mouth in the morning and at bedtime.   meloxicam 15 MG tablet Commonly known as: MOBIC Take 15 mg by mouth daily.   omeprazole 10 MG capsule Commonly known as: PRILOSEC Take 1 capsule (10 mg total) by mouth daily. As needed   Vitamin D (Ergocalciferol) 1.25 MG (50000 UNIT) Caps capsule Commonly known as: DRISDOL Take 1 capsule (50,000 Units total) by mouth once a week.        History (reviewed): Past Medical History:  Diagnosis Date   Ankle fracture    Bilateral   Cataract    GERD (gastroesophageal reflux disease)    Heel spur, right    Osteopenia    Thyroid disease    Vitamin D deficiency    Past Surgical History:  Procedure Laterality Date   ABDOMINAL HYSTERECTOMY     CATARACT EXTRACTION, BILATERAL  SPINE SURGERY     Family History  Problem Relation Age of Onset   Seizures Mother    Congestive Heart Failure Mother    Diabetes Father    Heart disease Father    Lupus Daughter    Ulcerative colitis Son    Diabetes Sister    Cancer Brother    Thyroid disease Sister    Prostate cancer Brother    Diabetes Brother    Diabetes Brother    Social History   Socioeconomic History   Marital status: Married    Spouse name: Not on file   Number of children: 4   Years of education: Not on file   Highest education level: GED or  equivalent  Occupational History   Occupation: Retired  Tobacco Use   Smoking status: Never Smoker   Smokeless tobacco: Never Used  Substance and Sexual Activity   Alcohol use: Never   Drug use: Never   Sexual activity: Not Currently  Other Topics Concern   Not on file  Social History Narrative   Not on file   Social Determinants of Health   Financial Resource Strain:    Difficulty of Paying Living Expenses:   Food Insecurity:    Worried About Programme researcher, broadcasting/film/video in the Last Year:    Barista in the Last Year:   Transportation Needs:    Freight forwarder (Medical):    Lack of Transportation (Non-Medical):   Physical Activity:    Days of Exercise per Week:    Minutes of Exercise per Session:   Stress:    Feeling of Stress :   Social Connections:    Frequency of Communication with Friends and Family:    Frequency of Social Gatherings with Friends and Family:    Attends Religious Services:    Active Member of Clubs or Organizations:    Attends Banker Meetings:    Marital Status:     Activities of Daily Living In your present state of health, do you have any difficulty performing the following activities: 03/14/2020  Hearing? N  Vision? N  Comment Reading glasses  Difficulty concentrating or making decisions? N  Walking or climbing stairs? Y  Comment Due to heel spur  Dressing or bathing? N  Doing errands, shopping? N  Preparing Food and eating ? N  Using the Toilet? N  In the past six months, have you accidently leaked urine? N  Do you have problems with loss of bowel control? N  Managing your Medications? N  Managing your Finances? N  Housekeeping or managing your Housekeeping? N  Some recent data might be hidden    Patient Education/ Literacy How often do you need to have someone help you when you read instructions, pamphlets, or other written materials from your doctor or pharmacy?: 1 - Never What is the last grade level you  completed in school?: GED  Exercise Current Exercise Habits: Home exercise routine, Type of exercise: walking, Time (Minutes): 20, Frequency (Times/Week): 3, Weekly Exercise (Minutes/Week): 60, Intensity: Mild, Exercise limited by: orthopedic condition(s)  Diet Patient reports consuming 3 meals a day and 1 snack(s) a day Patient reports that her primary diet is: Regular Patient reports that she does have regular access to food.   Depression Screen PHQ 2/9 Scores 03/14/2020 02/09/2020 11/02/2019  PHQ - 2 Score 0 0 0     Fall Risk Fall Risk  03/14/2020 02/09/2020 11/02/2019  Falls in the past year? 1 0 0  Number falls in past yr: 0 - -  Injury with Fall? 0 - -  Follow up Falls prevention discussed - -     Objective:  Khylie L Noh seemed alert and oriented and she participated appropriately during our telephone visit.  Blood Pressure Weight BMI  BP Readings from Last 3 Encounters:  02/09/20 119/74  11/02/19 116/72   Wt Readings from Last 3 Encounters:  02/09/20 187 lb 2 oz (84.9 kg)  11/02/19 184 lb 6.4 oz (83.6 kg)   BMI Readings from Last 1 Encounters:  02/09/20 32.12 kg/m    *Unable to obtain current vital signs, weight, and BMI due to telephone visit type  Hearing/Vision  Derhonda did not seem to have difficulty with hearing/understanding during the telephone conversation Reports that she has not had a formal eye exam by an eye care professional within the past year Reports that she has not had a formal hearing evaluation within the past year *Unable to fully assess hearing and vision during telephone visit type  Cognitive Function: No flowsheet data found. (Normal:0-7, Significant for Dysfunction: >8)  Normal Cognitive Function Screening: Yes   Immunization & Health Maintenance Record  There is no immunization history on file for this patient.  Health Maintenance  Topic Date Due   Hepatitis C Screening  Never done   URINE MICROALBUMIN  Never done   MAMMOGRAM   Never done   INFLUENZA VACCINE  03/29/2020 (Originally 07/31/2019)   TETANUS/TDAP  11/01/2020 (Originally 02/04/1966)   PNA vac Low Risk Adult (1 of 2 - PCV13) 11/01/2020 (Originally 02/05/2012)   COLONOSCOPY  02/08/2021 (Originally 02/04/1997)   DEXA SCAN  Completed       Assessment  This is a routine wellness examination for Shaunae L Belanger.  Health Maintenance: Due or Overdue Health Maintenance Due  Topic Date Due   Hepatitis C Screening  Never done   URINE MICROALBUMIN  Never done   MAMMOGRAM  Never done    Kandice Robinsons Lebron does not need a referral for Community Assistance: Care Management:   no Social Work:    no Prescription Assistance:  no Nutrition/Diabetes Education:  not applicable   Plan:  Personalized Goals Goals Addressed   None    Personalized Health Maintenance & Screening Recommendations  Hepatitis C screening  Lung Cancer Screening Recommended: no (Low Dose CT Chest recommended if Age 25-80 years, 30 pack-year currently smoking OR have quit w/in past 15 years) Hepatitis C Screening recommended: yes and will have done at next visit HIV Screening recommended:   Advanced Directives: Written information was not prepared per patient's request.  Referrals & Orders No orders of the defined types were placed in this encounter.   Follow-up Plan Follow-up with Remus Loffler, PA-C as planned    I have personally reviewed and noted the following in the patient's chart:   Medical and social history Use of alcohol, tobacco or illicit drugs  Current medications and supplements Functional ability and status Nutritional status Physical activity Advanced directives List of other physicians Hospitalizations, surgeries, and ER visits in previous 12 months Vitals Screenings to include cognitive, depression, and falls Referrals and appointments  In addition, I have reviewed and discussed with Burna Mortimer L Hartstein certain preventive protocols, quality metrics, and best  practice recommendations. A written personalized care plan for preventive services as well as general preventive health recommendations is available and can be mailed to the patient at her request.      Sherron Monday  03/14/2020  I have reviewed and agree with the above documentation.   Jannifer Rodney, FNP

## 2020-03-28 DIAGNOSIS — M79671 Pain in right foot: Secondary | ICD-10-CM | POA: Diagnosis not present

## 2020-03-28 DIAGNOSIS — M7731 Calcaneal spur, right foot: Secondary | ICD-10-CM | POA: Diagnosis not present

## 2020-04-25 ENCOUNTER — Other Ambulatory Visit: Payer: Self-pay | Admitting: *Deleted

## 2020-04-25 DIAGNOSIS — E039 Hypothyroidism, unspecified: Secondary | ICD-10-CM

## 2020-04-25 MED ORDER — EUTHYROX 88 MCG PO TABS: 88.0000 ug | ORAL_TABLET | Freq: Every day | ORAL | 1 refills | Status: DC

## 2020-05-01 ENCOUNTER — Encounter: Payer: Self-pay | Admitting: *Deleted

## 2020-07-04 DIAGNOSIS — M79671 Pain in right foot: Secondary | ICD-10-CM | POA: Diagnosis not present

## 2020-07-04 DIAGNOSIS — M7731 Calcaneal spur, right foot: Secondary | ICD-10-CM | POA: Diagnosis not present

## 2020-08-08 ENCOUNTER — Other Ambulatory Visit: Payer: Self-pay

## 2020-08-08 ENCOUNTER — Ambulatory Visit: Payer: Medicare HMO | Admitting: Physician Assistant

## 2020-08-08 ENCOUNTER — Encounter: Payer: Self-pay | Admitting: Family

## 2020-08-08 ENCOUNTER — Ambulatory Visit (INDEPENDENT_AMBULATORY_CARE_PROVIDER_SITE_OTHER): Payer: Medicare HMO | Admitting: Family

## 2020-08-08 VITALS — BP 136/75 | HR 66 | Temp 96.8°F | Ht 64.0 in | Wt 190.2 lb

## 2020-08-08 DIAGNOSIS — E039 Hypothyroidism, unspecified: Secondary | ICD-10-CM | POA: Diagnosis not present

## 2020-08-08 DIAGNOSIS — K219 Gastro-esophageal reflux disease without esophagitis: Secondary | ICD-10-CM | POA: Diagnosis not present

## 2020-08-08 DIAGNOSIS — E785 Hyperlipidemia, unspecified: Secondary | ICD-10-CM | POA: Diagnosis not present

## 2020-08-08 DIAGNOSIS — E559 Vitamin D deficiency, unspecified: Secondary | ICD-10-CM | POA: Diagnosis not present

## 2020-08-08 DIAGNOSIS — Z1159 Encounter for screening for other viral diseases: Secondary | ICD-10-CM

## 2020-08-08 DIAGNOSIS — Z1211 Encounter for screening for malignant neoplasm of colon: Secondary | ICD-10-CM

## 2020-08-08 NOTE — Patient Instructions (Signed)
 Heel Spur  A heel spur is a bony growth that forms on the bottom of the heel bone (calcaneus). Heel spurs are common. They often cause inflammation in the band of tissue that connects the toes to the heel bone (plantar fascia). This may cause pain on the bottom of the foot, near the heel. Many people with plantar fasciitis also have heel spurs. However, spurs are not the cause of plantar fasciitis pain. What are the causes? The exact cause of heel spurs is not known. They may be caused by:  Pressure on the heel bone.  Bands of tissue (tendons) pulling on the heel bone. What increases the risk? You are more likely to develop this condition if you:  Are older than 40.  Are overweight.  Have wear-and-tear arthritis (osteoarthritis).  Have plantar fascia inflammation.  Participate in sports or activities that include a lot of running or jumping.  Wear poorly fitted shoes. What are the signs or symptoms? Some people have no symptoms. If you do have symptoms, they may include:  Pain in the bottom of your heel.  Pain that is worse when you first get out of bed.  Pain that gets worse after walking or standing. How is this diagnosed? This condition may be diagnosed based on:  Your symptoms and medical history.  A physical exam.  A foot X-ray. How is this treated? Treatment for this condition depends on how much pain you have. Treatment options may include:  Doing stretching exercises.  Losing weight, if necessary.  Wearing specific shoes or inserts inside of shoes (orthotics) for comfort and support.  Wearing splints on your feet while you sleep. Splints keep your feet in a position (usually 90 degrees) that should prevent and relieve the pain you feel when you first get out of bed. They also make stretching easier in the morning.  Taking over-the-counter medicine to relieve pain, such as NSAIDs.  Using high-intensity sound waves to break up the heel spur  (extracorporeal shock wave therapy).  Getting steroid injections in your heel to reduce inflammation.  Having surgery, if your heel spur causes long-term (chronic) pain. Follow these instructions at home:  Activity  Avoid activities that cause pain until you recover, or for as long as directed by your health care provider.  Do stretching exercises as directed. Stretch before exercising or being physically active. Managing pain, stiffness, and swelling  If directed, put ice on your foot: ? Put ice in a plastic bag. ? Place a towel between your skin and the bag. ? Leave the ice on for 20 minutes, 2-3 times a day.  Move your toes often to avoid stiffness and to lessen swelling.  When possible, raise (elevate) your foot above the level of your heart while you are sitting or lying down. General instructions  Take over-the-counter and prescription medicines only as told by your health care provider.  Wear supportive shoes that fit well. Wear splints, inserts, or orthotics as told by your health care provider.  If recommended, work with your health care provider to lose weight. This can relieve pressure on your foot.  Do not use any products that contain nicotine or tobacco, such as cigarettes and e-cigarettes. These can affect bone growth and healing. If you need help quitting, ask your health care provider.  Keep all follow-up visits as told by your health care provider. This is important. Contact a health care provider if:  Your pain does not go away with treatment.  Your pain   gets worse. Summary  A heel spur is a bony growth that forms on the bottom of the heel bone (calcaneus).  Heel spurs often cause inflammation in the band of tissue that connects the toes to the heel bone (plantar fascia). This may cause pain on the bottom of the foot, near the heel.  Doing stretching exercises, losing weight, wearing specific shoes or shoe inserts, wearing splints while you sleep, and  taking pain medicine may ease the pain and stiffness.  Other treatment options may include high-intensity sound waves to break up the heel spur, steroid injections, or surgery. This information is not intended to replace advice given to you by your health care provider. Make sure you discuss any questions you have with your health care provider. Document Revised: 12/03/2017 Document Reviewed: 12/03/2017 Elsevier Patient Education  2020 [employer] Inc[employer]  

## 2020-08-08 NOTE — Progress Notes (Signed)
Subjective:    Patient ID: Diamond Wright, female    DOB: December 12, 1946, 74 y.o.   MRN: [id number]  Chief Complaint  Patient presents with  . Medical Management of Chronic Issues    6 mth rck, jones patient   . Hypothyroidism  . Heel Spurs    both   . Ear Pain   Pt presents to the office today to establish care with me. She is followed by Podiatry every 6 months for bilateral heel spurs.  Thyroid Problem Presents for follow-up visit. Symptoms include constipation. Patient reports no anxiety or fatigue. The symptoms have been stable.  Arthritis Presents for follow-up visit. Affected locations include the right foot (back). Her pain is at a severity of 7/10. Pertinent negatives include no fatigue.  Gastroesophageal Reflux She complains of belching and heartburn. She reports no coughing or no sore throat. This is a chronic problem. The current episode started more than 1 year ago. The problem occurs occasionally. The problem has been waxing and waning. Pertinent negatives include no fatigue. Risk factors include obesity. She has tried a PPI for the symptoms. The treatment provided moderate relief.  Otalgia  There is pain in both ears. This is a new problem. The current episode started more than 1 month ago. The problem occurs every few minutes. The problem has been waxing and waning. There has been no fever. The pain is at a severity of 4/10. The pain is moderate. Pertinent negatives include no coughing, ear discharge, hearing loss, rhinorrhea or sore throat. She has tried nothing for the symptoms. The treatment provided no relief.      Review of Systems  Constitutional: Negative for fatigue.  HENT: Positive for ear pain. Negative for ear discharge, hearing loss, rhinorrhea and sore throat.   Respiratory: Negative for cough.   Gastrointestinal: Positive for constipation and heartburn.  Musculoskeletal: Positive for arthritis.  Psychiatric/Behavioral: The patient is not nervous/anxious.     All other systems reviewed and are negative.      Objective:   Physical Exam Vitals reviewed.  Constitutional:      General: She is not in acute distress.    Appearance: She is well-developed.  HENT:     Head: Normocephalic and atraumatic.     Right Ear: Tympanic membrane normal.     Left Ear: Tympanic membrane normal.  Eyes:     Pupils: Pupils are equal, round, and reactive to light.  Neck:     Thyroid: No thyromegaly.  Cardiovascular:     Rate and Rhythm: Normal rate and regular rhythm.     Heart sounds: Normal heart sounds. No murmur heard.   Pulmonary:     Effort: Pulmonary effort is normal. No respiratory distress.     Breath sounds: Normal breath sounds. No wheezing.  Abdominal:     General: Bowel sounds are normal. There is no distension.     Palpations: Abdomen is soft.     Tenderness: There is no abdominal tenderness.  Musculoskeletal:        General: No tenderness. Normal range of motion.     Cervical back: Normal range of motion and neck supple.     Right lower leg: Edema (2+) present.     Left lower leg: Edema (2+) present.  Skin:    General: Skin is warm and dry.  Neurological:     Mental Status: She is alert and oriented to person, place, and time.     Cranial Nerves: No cranial nerve deficit.  Deep Tendon Reflexes: Reflexes are normal and symmetric.  Psychiatric:        Behavior: Behavior normal.        Thought Content: Thought content normal.        Judgment: Judgment normal.       BP 136/75   Pulse 66   Temp (!) 96.8 F (36 C) (Temporal)   Ht '5\' 4"'$  (1.626 m)   Wt 190 lb 3.2 oz (86.3 kg)   SpO2 97%   BMI 32.65 kg/m      Assessment & Plan:  Diamond Wright comes in today with chief complaint of Medical Management of Chronic Issues (6 mth rck, jones patient ), Hypothyroidism, Heel Spurs (both ), and Ear Pain   Diagnosis and orders addressed:  1. Gastroesophageal reflux disease without esophagitis - CMP14+EGFR - CBC with  Differential/Platelet  2. Hypothyroidism, unspecified type - CMP14+EGFR - CBC with Differential/Platelet - TSH  3. Dyslipidemia - CMP14+EGFR - CBC with Differential/Platelet - Lipid panel  4. Vitamin D deficiency - CMP14+EGFR - CBC with Differential/Platelet - VITAMIN D 25 Hydroxy (Vit-D Deficiency, Fractures)  5. Colon cancer screening - Cologuard - CMP14+EGFR - CBC with Differential/Platelet  6. Need for hepatitis C screening test - CMP14+EGFR - CBC with Differential/Platelet - Hepatitis C antibody   Labs pending Health Maintenance reviewed Diet and exercise encouraged  Follow up plan: 6 months    Evelina Dun, FNP

## 2020-08-09 LAB — CBC WITH DIFFERENTIAL/PLATELET
Basophils Absolute: 0.1 10*3/uL (ref 0.0–0.2)
Basos: 1 %
EOS (ABSOLUTE): 0.3 10*3/uL (ref 0.0–0.4)
Eos: 3 %
Hematocrit: 41.9 % (ref 34.0–46.6)
Hemoglobin: 13.8 g/dL (ref 11.1–15.9)
Immature Grans (Abs): 0 10*3/uL (ref 0.0–0.1)
Immature Granulocytes: 0 %
Lymphocytes Absolute: 2.3 10*3/uL (ref 0.7–3.1)
Lymphs: 26 %
MCH: 30.5 pg (ref 26.6–33.0)
MCHC: 32.9 g/dL (ref 31.5–35.7)
MCV: 93 fL (ref 79–97)
Monocytes Absolute: 0.6 10*3/uL (ref 0.1–0.9)
Monocytes: 7 %
Neutrophils Absolute: 5.6 10*3/uL (ref 1.4–7.0)
Neutrophils: 63 %
Platelets: 193 10*3/uL (ref 150–450)
RBC: 4.52 x10E6/uL (ref 3.77–5.28)
RDW: 12.8 % (ref 11.7–15.4)
WBC: 8.8 10*3/uL (ref 3.4–10.8)

## 2020-08-09 LAB — CMP14+EGFR
ALT: 18 IU/L (ref 0–32)
AST: 20 IU/L (ref 0–40)
Albumin/Globulin Ratio: 1.5 (ref 1.2–2.2)
Albumin: 4 g/dL (ref 3.7–4.7)
Alkaline Phosphatase: 94 IU/L (ref 48–121)
BUN/Creatinine Ratio: 25 (ref 12–28)
BUN: 20 mg/dL (ref 8–27)
Bilirubin Total: 0.3 mg/dL (ref 0.0–1.2)
CO2: 25 mmol/L (ref 20–29)
Calcium: 9.4 mg/dL (ref 8.7–10.3)
Chloride: 106 mmol/L (ref 96–106)
Creatinine, Ser: 0.79 mg/dL (ref 0.57–1.00)
GFR calc Af Amer: 86 mL/min/{1.73_m2} (ref >59)
GFR calc non Af Amer: 74 mL/min/{1.73_m2} (ref >59)
Globulin, Total: 2.6 g/dL (ref 1.5–4.5)
Glucose: 84 mg/dL (ref 65–99)
Potassium: 4.3 mmol/L (ref 3.5–5.2)
Sodium: 142 mmol/L (ref 134–144)
Total Protein: 6.6 g/dL (ref 6.0–8.5)

## 2020-08-09 LAB — LIPID PANEL
Chol/HDL Ratio: 5.7 ratio — ABNORMAL HIGH (ref 0.0–4.4)
Cholesterol, Total: 172 mg/dL (ref 100–199)
HDL: 30 mg/dL — ABNORMAL LOW (ref >39)
LDL Chol Calc (NIH): 96 mg/dL (ref 0–99)
Triglycerides: 271 mg/dL — ABNORMAL HIGH (ref 0–149)
VLDL Cholesterol Cal: 46 mg/dL — ABNORMAL HIGH (ref 5–40)

## 2020-08-09 LAB — VITAMIN D 25 HYDROXY (VIT D DEFICIENCY, FRACTURES): Vit D, 25-Hydroxy: 62.4 ng/mL (ref 30.0–100.0)

## 2020-08-09 LAB — HEPATITIS C ANTIBODY: Hep C Virus Ab: <0.1 s/co ratio (ref 0.0–0.9)

## 2020-08-09 LAB — TSH: TSH: 1.21 u[IU]/mL (ref 0.450–4.500)

## 2020-08-11 ENCOUNTER — Other Ambulatory Visit: Payer: Self-pay | Admitting: Family

## 2020-08-11 MED ORDER — ATORVASTATIN CALCIUM 20 MG PO TABS
20.0000 mg | ORAL_TABLET | Freq: Every day | ORAL | 3 refills | Status: DC
Start: 2020-08-11 — End: 2021-07-31

## 2020-08-15 ENCOUNTER — Telehealth: Payer: Self-pay | Admitting: Family

## 2020-08-15 NOTE — Telephone Encounter (Signed)
Aware of lab results and recommendations  

## 2020-09-07 DIAGNOSIS — J101 Influenza due to other identified influenza virus with other respiratory manifestations: Secondary | ICD-10-CM | POA: Diagnosis not present

## 2020-09-07 DIAGNOSIS — B974 Respiratory syncytial virus as the cause of diseases classified elsewhere: Secondary | ICD-10-CM | POA: Diagnosis not present

## 2020-09-07 DIAGNOSIS — U071 COVID-19: Secondary | ICD-10-CM | POA: Diagnosis not present

## 2020-09-07 DIAGNOSIS — Z20828 Contact with and (suspected) exposure to other viral communicable diseases: Secondary | ICD-10-CM | POA: Diagnosis not present

## 2020-09-09 ENCOUNTER — Other Ambulatory Visit (HOSPITAL_COMMUNITY): Payer: Self-pay | Admitting: Nurse Practitioner

## 2020-09-09 DIAGNOSIS — U071 COVID-19: Secondary | ICD-10-CM

## 2020-09-09 NOTE — Progress Notes (Signed)
I connected by phone with Diamond Wright on 09/09/2020 at 2:01 PM to discuss the potential use of an new treatment for mild to moderate COVID-19 viral infection in non-hospitalized patients.  This patient is a 74 y.o. female that meets the FDA criteria for Emergency Use Authorization of casirivimab\imdevimab.  Has a (+) direct SARS-CoV-2 viral test result  Has mild or moderate COVID-19   Is ? 74 years of age and weighs ? 40 kg  Is NOT hospitalized due to COVID-19  Is NOT requiring oxygen therapy or requiring an increase in baseline oxygen flow rate due to COVID-19  Is within 10 days of symptom onset  Has at least one of the high risk factor(s) for progression to severe COVID-19 and/or hospitalization as defined in EUA.  Specific high risk criteria : Older age (>/= 73 yo) and BMI > 25   I have spoken and communicated the following to the patient or parent/caregiver:  1. FDA has authorized the emergency use of casirivimab\imdevimab for the treatment of mild to moderate COVID-19 in adults and pediatric patients with positive results of direct SARS-CoV-2 viral testing who are 57 years of age and older weighing at least 40 kg, and who are at high risk for progressing to severe COVID-19 and/or hospitalization.  2. The significant known and potential risks and benefits of casirivimab\imdevimab, and the extent to which such potential risks and benefits are unknown.  3. Information on available alternative treatments and the risks and benefits of those alternatives, including clinical trials.  4. Patients treated with casirivimab\imdevimab should continue to self-isolate and use infection control measures (e.g., wear mask, isolate, social distance, avoid sharing personal items, clean and disinfect "high touch" surfaces, and frequent handwashing) according to CDC guidelines.   5. The patient or parent/caregiver has the option to accept or refuse casirivimab\imdevimab .  After reviewing this  information with the patient, The patient agreed to proceed with receiving casirivimab\imdevimab infusion and will be provided a copy of the Fact sheet prior to receiving the infusion.Consuello Masse, DNP, AGNP-C [phone] (Infusion Center Hotline)

## 2020-09-10 ENCOUNTER — Other Ambulatory Visit (HOSPITAL_COMMUNITY): Payer: Self-pay

## 2020-09-10 ENCOUNTER — Ambulatory Visit (HOSPITAL_COMMUNITY)
Admission: RE | Admit: 2020-09-10 | Discharge: 2020-09-10 | Disposition: A | Discharge status: Home / Self Care | Payer: Medicare Other | Source: Ambulatory Visit | Attending: Pulmonary Disease | Admitting: Pulmonary Disease

## 2020-09-10 DIAGNOSIS — U071 COVID-19: Secondary | ICD-10-CM | POA: Insufficient documentation

## 2020-09-10 DIAGNOSIS — Z23 Encounter for immunization: Secondary | ICD-10-CM | POA: Diagnosis not present

## 2020-09-10 MED ORDER — ALBUTEROL SULFATE HFA 108 (90 BASE) MCG/ACT IN AERS: 2.0000 | INHALATION_SPRAY | Freq: Once | RESPIRATORY_TRACT | Status: DC | PRN

## 2020-09-10 MED ORDER — METHYLPREDNISOLONE SODIUM SUCC 125 MG IJ SOLR: 125.0000 mg | Freq: Once | INTRAMUSCULAR | Status: DC | PRN

## 2020-09-10 MED ORDER — ACETAMINOPHEN 325 MG PO TABS
650.0000 mg | ORAL_TABLET | Freq: Once | ORAL | Status: AC
  Administered 2020-09-10: 650 mg via ORAL
  Filled 2020-09-10: qty 2

## 2020-09-10 MED ORDER — SODIUM CHLORIDE 0.9 % IV SOLN: INTRAVENOUS | Status: DC | PRN

## 2020-09-10 MED ORDER — FAMOTIDINE IN NACL 20-0.9 MG/50ML-% IV SOLN: 20.0000 mg | Freq: Once | INTRAVENOUS | Status: DC | PRN

## 2020-09-10 MED ORDER — SODIUM CHLORIDE 0.9 % IV SOLN
1200.0000 mg | Freq: Once | INTRAVENOUS | Status: AC
  Administered 2020-09-10: 1200 mg via INTRAVENOUS
  Filled 2020-09-10: qty 10

## 2020-09-10 MED ORDER — DIPHENHYDRAMINE HCL 50 MG/ML IJ SOLN: 50.0000 mg | Freq: Once | INTRAMUSCULAR | Status: DC | PRN

## 2020-09-10 MED ORDER — EPINEPHRINE 0.3 MG/0.3ML IJ SOAJ: 0.3000 mg | Freq: Once | INTRAMUSCULAR | Status: DC | PRN

## 2020-09-10 NOTE — Discharge Instructions (Signed)

## 2020-09-10 NOTE — Progress Notes (Signed)
  Diagnosis: COVID-19  Physician: Patrick Wright, MD  Procedure: [facility] Med: casirivimab\imdevimab infusion - Provided patient with casirivimab\imdevimab fact sheet for patients, parents and caregivers prior to infusion.  Complications: No immediate complications noted.  Discharge: Discharged home   Malaiyah Achorn N Deyani Hegarty 09/10/2020  

## 2020-10-02 DIAGNOSIS — Z1211 Encounter for screening for malignant neoplasm of colon: Secondary | ICD-10-CM | POA: Diagnosis not present

## 2020-10-10 LAB — COLOGUARD: Cologuard: NEGATIVE

## 2020-10-26 ENCOUNTER — Other Ambulatory Visit: Payer: Self-pay | Admitting: Family

## 2020-10-26 DIAGNOSIS — E039 Hypothyroidism, unspecified: Secondary | ICD-10-CM

## 2020-10-27 ENCOUNTER — Encounter: Payer: Self-pay | Admitting: Family

## 2020-11-07 DIAGNOSIS — M7731 Calcaneal spur, right foot: Secondary | ICD-10-CM | POA: Diagnosis not present

## 2020-11-07 DIAGNOSIS — M79671 Pain in right foot: Secondary | ICD-10-CM | POA: Diagnosis not present

## 2020-12-12 ENCOUNTER — Other Ambulatory Visit: Payer: Self-pay | Admitting: *Deleted

## 2020-12-12 DIAGNOSIS — E559 Vitamin D deficiency, unspecified: Secondary | ICD-10-CM

## 2021-01-29 ENCOUNTER — Ambulatory Visit (INDEPENDENT_AMBULATORY_CARE_PROVIDER_SITE_OTHER): Payer: Medicare HMO | Admitting: Family

## 2021-01-29 ENCOUNTER — Encounter: Payer: Self-pay | Admitting: Family

## 2021-01-29 ENCOUNTER — Other Ambulatory Visit: Payer: Self-pay

## 2021-01-29 VITALS — BP 122/63 | HR 69 | Temp 95.6°F | Ht 64.0 in | Wt 193.4 lb

## 2021-01-29 DIAGNOSIS — M67971 Unspecified disorder of synovium and tendon, right ankle and foot: Secondary | ICD-10-CM

## 2021-01-29 DIAGNOSIS — E039 Hypothyroidism, unspecified: Secondary | ICD-10-CM

## 2021-01-29 DIAGNOSIS — Z7189 Other specified counseling: Secondary | ICD-10-CM | POA: Insufficient documentation

## 2021-01-29 DIAGNOSIS — K219 Gastro-esophageal reflux disease without esophagitis: Secondary | ICD-10-CM | POA: Diagnosis not present

## 2021-01-29 DIAGNOSIS — E785 Hyperlipidemia, unspecified: Secondary | ICD-10-CM | POA: Diagnosis not present

## 2021-01-29 DIAGNOSIS — E559 Vitamin D deficiency, unspecified: Secondary | ICD-10-CM | POA: Diagnosis not present

## 2021-01-29 MED ORDER — MELOXICAM 15 MG PO TABS: 15.0000 mg | ORAL_TABLET | Freq: Every day | ORAL | 1 refills | Status: DC

## 2021-01-29 MED ORDER — VITAMIN D (ERGOCALCIFEROL) 1.25 MG (50000 UNIT) PO CAPS: 50000.0000 [IU] | ORAL_CAPSULE | ORAL | 11 refills | Status: DC

## 2021-01-29 MED ORDER — OMEPRAZOLE 10 MG PO CPDR: 10.0000 mg | DELAYED_RELEASE_CAPSULE | Freq: Every day | ORAL | 3 refills | Status: DC

## 2021-01-29 MED ORDER — EUTHYROX 88 MCG PO TABS: 88.0000 ug | ORAL_TABLET | Freq: Every day | ORAL | 2 refills | Status: DC

## 2021-01-29 NOTE — Patient Instructions (Signed)
Hypothyroidism  Hypothyroidism is when the thyroid gland does not make enough of certain hormones (it is underactive). The thyroid gland is a small gland located in the lower front part of the neck, just in front of the windpipe (trachea). This gland makes hormones that help control how the body uses food for energy (metabolism) as well as how the heart and brain function. These hormones also play a role in keeping your bones strong. When the thyroid is underactive, it produces too little of the hormones thyroxine (T4) and triiodothyronine (T3). What are the causes? This condition may be caused by:  Hashimoto's disease. This is a disease in which the body's disease-fighting system (immune system) attacks the thyroid gland. This is the most common cause.  Viral infections.  Pregnancy.  Certain medicines.  Birth defects.  Past radiation treatments to the head or neck for cancer.  Past treatment with radioactive iodine.  Past exposure to radiation in the environment.  Past surgical removal of part or all of the thyroid.  Problems with a gland in the center of the brain (pituitary gland).  Lack of enough iodine in the diet. What increases the risk? You are more likely to develop this condition if:  You are female.  You have a family history of thyroid conditions.  You use a medicine called lithium.  You take medicines that affect the immune system (immunosuppressants). What are the signs or symptoms? Symptoms of this condition include:  Feeling as though you have no energy (lethargy).  Not being able to tolerate cold.  Weight gain that is not explained by a change in diet or exercise habits.  Lack of appetite.  Dry skin.  Coarse hair.  Menstrual irregularity.  Slowing of thought processes.  Constipation.  Sadness or depression. How is this diagnosed? This condition may be diagnosed based on:  Your symptoms, your medical history, and a physical exam.  Blood  tests. You may also have imaging tests, such as an ultrasound or MRI. How is this treated? This condition is treated with medicine that replaces the thyroid hormones that your body does not make. After you begin treatment, it may take several weeks for symptoms to go away. Follow these instructions at home:  Take over-the-counter and prescription medicines only as told by your health care provider.  If you start taking any new medicines, tell your health care provider.  Keep all follow-up visits as told by your health care provider. This is important. ? As your condition improves, your dosage of thyroid hormone medicine may change. ? You will need to have blood tests regularly so that your health care provider can monitor your condition. Contact a health care provider if:  Your symptoms do not get better with treatment.  You are taking thyroid hormone replacement medicine and you: ? Sweat a lot. ? Have tremors. ? Feel anxious. ? Lose weight rapidly. ? Cannot tolerate heat. ? Have emotional swings. ? Have diarrhea. ? Feel weak. Get help right away if you have:  Chest pain.  An irregular heartbeat.  A rapid heartbeat.  Difficulty breathing. Summary  Hypothyroidism is when the thyroid gland does not make enough of certain hormones (it is underactive).  When the thyroid is underactive, it produces too little of the hormones thyroxine (T4) and triiodothyronine (T3).  The most common cause is Hashimoto's disease, a disease in which the body's disease-fighting system (immune system) attacks the thyroid gland. The condition can also be caused by viral infections, medicine, pregnancy, or   past radiation treatment to the head or neck.  Symptoms may include weight gain, dry skin, constipation, feeling as though you do not have energy, and not being able to tolerate cold.  This condition is treated with medicine to replace the thyroid hormones that your body does not make. This  information is not intended to replace advice given to you by your health care provider. Make sure you discuss any questions you have with your health care provider. Document Revised: 09/15/2020 Document Reviewed: 08/31/2020 Elsevier Patient Education  2021 Elsevier Inc.  

## 2021-01-29 NOTE — Progress Notes (Signed)
Subjective:    Patient ID: Diamond Wright, female    DOB: 08/07/1946, 75 y.o.   MRN: [id number]  Chief Complaint  Patient presents with  . Medical Management of Chronic Issues  . Gastroesophageal Reflux  . Hypothyroidism    Pt presents to the office today for chronic follow up. She is followed by Podiatry every 6 months for bilateral heel spurs. She takes Mobic 15 mg for this that helps.   She reports she had COVID 08/2020 and reports it took her a month to get back to "normal". She reports since having COVID she has noticed increase hair loss.  Gastroesophageal Reflux She complains of belching and a hoarse voice. This is a chronic problem. The current episode started more than 1 year ago. The problem occurs occasionally. Pertinent negatives include no fatigue. She has tried an antacid and a PPI (PPI as needed) for the symptoms. The treatment provided moderate relief.  Thyroid Problem Presents for follow-up visit. Symptoms include hair loss and hoarse voice. Patient reports no depressed mood, fatigue or visual change. The symptoms have been worsening. Her past medical history is significant for hyperlipidemia.  Hyperlipidemia This is a chronic problem. The current episode started more than 1 year ago. Current antihyperlipidemic treatment includes statins. The current treatment provides moderate improvement of lipids. Risk factors for coronary artery disease include a sedentary lifestyle and post-menopausal.      Review of Systems  Constitutional: Negative for fatigue.  HENT: Positive for hoarse voice.   All other systems reviewed and are negative.      Objective:   Physical Exam Vitals reviewed.  Constitutional:      General: She is not in acute distress.    Appearance: She is well-developed and well-nourished.  HENT:     Head: Normocephalic and atraumatic.     Right Ear: Tympanic membrane normal.     Left Ear: Tympanic membrane normal.     Mouth/Throat:     Mouth:  Oropharynx is clear and moist.  Eyes:     Pupils: Pupils are equal, round, and reactive to light.  Neck:     Thyroid: No thyromegaly.  Cardiovascular:     Rate and Rhythm: Normal rate and regular rhythm.     Pulses: Intact distal pulses.     Heart sounds: Normal heart sounds. No murmur heard.   Pulmonary:     Effort: Pulmonary effort is normal. No respiratory distress.     Breath sounds: Normal breath sounds. No wheezing.  Abdominal:     General: Bowel sounds are normal. There is no distension.     Palpations: Abdomen is soft.     Tenderness: There is no abdominal tenderness.  Musculoskeletal:        General: No tenderness or edema. Normal range of motion.     Cervical back: Normal range of motion and neck supple.  Skin:    General: Skin is warm and dry.  Neurological:     Mental Status: She is alert and oriented to person, place, and time.     Cranial Nerves: No cranial nerve deficit.     Deep Tendon Reflexes: Reflexes are normal and symmetric.  Psychiatric:        Mood and Affect: Mood and affect normal.        Behavior: Behavior normal.        Thought Content: Thought content normal.        Judgment: Judgment normal.  BP 122/63 (BP Location: Right Arm)   Pulse 69   Temp (!) 95.6 F (35.3 C) (Temporal)   Ht $R'5\' 4"'is$  (1.626 m)   Wt 193 lb 6.4 oz (87.7 kg)   SpO2 97%   BMI 33.20 kg/m   Assessment & Plan:  Diamond Wright comes in today with chief complaint of Medical Management of Chronic Issues, Gastroesophageal Reflux, and Hypothyroidism   Diagnosis and orders addressed:  1. Hypothyroidism, unspecified type - EUTHYROX 88 MCG tablet; Take 1 tablet (88 mcg total) by mouth daily.  Dispense: 90 tablet; Refill: 2 - CMP14+EGFR - CBC with Differential/Platelet - TSH  2. Achilles tendon disorder, right - meloxicam (MOBIC) 15 MG tablet; Take 1 tablet (15 mg total) by mouth daily.  Dispense: 90 tablet; Refill: 1 - CMP14+EGFR - CBC with  Differential/Platelet  3. Vitamin D deficiency - Vitamin D, Ergocalciferol, (DRISDOL) 1.25 MG (50000 UNIT) CAPS capsule; Take 1 capsule (50,000 Units total) by mouth once a week.  Dispense: 5 capsule; Refill: 11 - CMP14+EGFR - CBC with Differential/Platelet - VITAMIN D 25 Hydroxy (Vit-D Deficiency, Fractures)  4. Gastroesophageal reflux disease without esophagitis - omeprazole (PRILOSEC) 10 MG capsule; Take 1 capsule (10 mg total) by mouth daily. As needed  Dispense: 90 capsule; Refill: 3 - CMP14+EGFR - CBC with Differential/Platelet  5. Dyslipidemia - CMP14+EGFR - CBC with Differential/Platelet  6. Educated about COVID-19 virus infection - Pt will think about getting vaccinated   Labs pending Health Maintenance reviewed Diet and exercise encouraged  Follow up plan: 6 months    Evelina Dun, FNP

## 2021-01-30 LAB — CMP14+EGFR
ALT: 16 IU/L (ref 0–32)
AST: 17 IU/L (ref 0–40)
Albumin/Globulin Ratio: 1.4 (ref 1.2–2.2)
Albumin: 3.9 g/dL (ref 3.7–4.7)
Alkaline Phosphatase: 101 IU/L (ref 44–121)
BUN/Creatinine Ratio: 16 (ref 12–28)
BUN: 13 mg/dL (ref 8–27)
Bilirubin Total: 0.4 mg/dL (ref 0.0–1.2)
CO2: 24 mmol/L (ref 20–29)
Calcium: 9.3 mg/dL (ref 8.7–10.3)
Chloride: 102 mmol/L (ref 96–106)
Creatinine, Ser: 0.8 mg/dL (ref 0.57–1.00)
GFR calc Af Amer: 85 mL/min/{1.73_m2} (ref >59)
GFR calc non Af Amer: 73 mL/min/{1.73_m2} (ref >59)
Globulin, Total: 2.8 g/dL (ref 1.5–4.5)
Glucose: 97 mg/dL (ref 65–99)
Potassium: 4.5 mmol/L (ref 3.5–5.2)
Sodium: 140 mmol/L (ref 134–144)
Total Protein: 6.7 g/dL (ref 6.0–8.5)

## 2021-01-30 LAB — CBC WITH DIFFERENTIAL/PLATELET
Basophils Absolute: 0.1 10*3/uL (ref 0.0–0.2)
Basos: 1 %
EOS (ABSOLUTE): 0.2 10*3/uL (ref 0.0–0.4)
Eos: 3 %
Hematocrit: 43.7 % (ref 34.0–46.6)
Hemoglobin: 14.4 g/dL (ref 11.1–15.9)
Immature Grans (Abs): 0 10*3/uL (ref 0.0–0.1)
Immature Granulocytes: 0 %
Lymphocytes Absolute: 2.4 10*3/uL (ref 0.7–3.1)
Lymphs: 27 %
MCH: 30.1 pg (ref 26.6–33.0)
MCHC: 33 g/dL (ref 31.5–35.7)
MCV: 91 fL (ref 79–97)
Monocytes Absolute: 0.6 10*3/uL (ref 0.1–0.9)
Monocytes: 7 %
Neutrophils Absolute: 5.5 10*3/uL (ref 1.4–7.0)
Neutrophils: 62 %
Platelets: 215 10*3/uL (ref 150–450)
RBC: 4.79 x10E6/uL (ref 3.77–5.28)
RDW: 12.2 % (ref 11.7–15.4)
WBC: 8.7 10*3/uL (ref 3.4–10.8)

## 2021-01-30 LAB — VITAMIN D 25 HYDROXY (VIT D DEFICIENCY, FRACTURES): Vit D, 25-Hydroxy: 55.1 ng/mL (ref 30.0–100.0)

## 2021-01-30 LAB — TSH: TSH: 1.44 u[IU]/mL (ref 0.450–4.500)

## 2021-05-08 DIAGNOSIS — M7731 Calcaneal spur, right foot: Secondary | ICD-10-CM | POA: Diagnosis not present

## 2021-05-08 DIAGNOSIS — M79671 Pain in right foot: Secondary | ICD-10-CM | POA: Diagnosis not present

## 2021-06-07 ENCOUNTER — Other Ambulatory Visit: Payer: Self-pay | Admitting: Family

## 2021-06-07 DIAGNOSIS — Z1231 Encounter for screening mammogram for malignant neoplasm of breast: Secondary | ICD-10-CM

## 2021-07-09 ENCOUNTER — Ambulatory Visit
Admission: RE | Admit: 2021-07-09 | Discharge: 2021-07-09 | Disposition: A | Discharge status: Home / Self Care | Payer: Medicare Other | Source: Ambulatory Visit | Attending: Family | Admitting: Family

## 2021-07-09 ENCOUNTER — Other Ambulatory Visit: Payer: Self-pay

## 2021-07-09 DIAGNOSIS — Z1231 Encounter for screening mammogram for malignant neoplasm of breast: Secondary | ICD-10-CM | POA: Diagnosis not present

## 2021-07-13 ENCOUNTER — Other Ambulatory Visit: Payer: Self-pay | Admitting: Family

## 2021-07-13 DIAGNOSIS — R928 Other abnormal and inconclusive findings on diagnostic imaging of breast: Secondary | ICD-10-CM

## 2021-07-31 ENCOUNTER — Other Ambulatory Visit: Payer: Self-pay

## 2021-07-31 ENCOUNTER — Encounter: Payer: Self-pay | Admitting: Family

## 2021-07-31 ENCOUNTER — Ambulatory Visit (INDEPENDENT_AMBULATORY_CARE_PROVIDER_SITE_OTHER): Payer: Medicare HMO | Admitting: Family

## 2021-07-31 VITALS — BP 158/87 | HR 65 | Temp 96.9°F | Ht 64.0 in | Wt 194.4 lb

## 2021-07-31 DIAGNOSIS — E669 Obesity, unspecified: Secondary | ICD-10-CM | POA: Insufficient documentation

## 2021-07-31 DIAGNOSIS — E559 Vitamin D deficiency, unspecified: Secondary | ICD-10-CM

## 2021-07-31 DIAGNOSIS — E039 Hypothyroidism, unspecified: Secondary | ICD-10-CM | POA: Diagnosis not present

## 2021-07-31 DIAGNOSIS — E785 Hyperlipidemia, unspecified: Secondary | ICD-10-CM

## 2021-07-31 DIAGNOSIS — K219 Gastro-esophageal reflux disease without esophagitis: Secondary | ICD-10-CM

## 2021-07-31 DIAGNOSIS — M7730 Calcaneal spur, unspecified foot: Secondary | ICD-10-CM | POA: Diagnosis not present

## 2021-07-31 MED ORDER — EUTHYROX 88 MCG PO TABS: 88.0000 ug | ORAL_TABLET | Freq: Every day | ORAL | 2 refills | Status: DC

## 2021-07-31 MED ORDER — ATORVASTATIN CALCIUM 20 MG PO TABS: 20.0000 mg | ORAL_TABLET | Freq: Every day | ORAL | 3 refills | Status: DC

## 2021-07-31 MED ORDER — OMEPRAZOLE 10 MG PO CPDR: 10.0000 mg | DELAYED_RELEASE_CAPSULE | Freq: Every day | ORAL | 3 refills | Status: DC

## 2021-07-31 NOTE — Progress Notes (Signed)
Subjective:    Patient ID: Diamond Wright, female    DOB: 1946-01-06, 75 y.o.   MRN: [id number]  Chief Complaint  Patient presents with   Medical Management of Chronic Issues   Foot Pain    Both heels. Patient would like ANA test both daughters have something.   Pt presents to the office today for chronic follow up. She is followed by Podiatry every 6 months for bilateral heel spurs. She takes Mobic 15 mg for this that helps. Foot Pain This is a chronic problem. The current episode started more than 1 year ago. The problem occurs constantly. The problem has been waxing and waning. Associated symptoms include fatigue (some days). Associated symptoms comments: 8 out 10. The symptoms are aggravated by walking.  Gastroesophageal Reflux She complains of belching and heartburn. This is a chronic problem. The current episode started more than 1 year ago. The problem occurs occasionally. Associated symptoms include fatigue (some days). She has tried a PPI for the symptoms. The treatment provided moderate relief.  Thyroid Problem Presents for follow-up visit. Symptoms include fatigue (some days). Patient reports no constipation, depressed mood or hair loss. The symptoms have been stable. Her past medical history is significant for hyperlipidemia.  Hyperlipidemia This is a chronic problem. The current episode started more than 1 year ago. The problem is controlled. Current antihyperlipidemic treatment includes statins. The current treatment provides moderate improvement of lipids. Risk factors for coronary artery disease include dyslipidemia, hypertension and a sedentary lifestyle.     Review of Systems  Constitutional:  Positive for fatigue (some days).  Gastrointestinal:  Positive for heartburn. Negative for constipation.  All other systems reviewed and are negative.     Objective:   Physical Exam Vitals reviewed.  Constitutional:      General: She is not in acute distress.    Appearance:  She is well-developed.  HENT:     Head: Normocephalic and atraumatic.     Right Ear: Tympanic membrane normal.     Left Ear: Tympanic membrane normal.  Eyes:     Pupils: Pupils are equal, round, and reactive to light.  Neck:     Thyroid: No thyromegaly.  Cardiovascular:     Rate and Rhythm: Normal rate and regular rhythm.     Heart sounds: Normal heart sounds. No murmur heard. Pulmonary:     Effort: Pulmonary effort is normal. No respiratory distress.     Breath sounds: Normal breath sounds. No wheezing.  Abdominal:     General: Bowel sounds are normal. There is no distension.     Palpations: Abdomen is soft.     Tenderness: There is no abdominal tenderness.  Musculoskeletal:        General: Swelling and tenderness present. Normal range of motion.     Cervical back: Normal range of motion and neck supple.     Right lower leg: Edema (3+ ankle and heel) present.     Left lower leg: Left lower leg edema: 3+ ankle and heel.  Skin:    General: Skin is warm and dry.  Neurological:     Mental Status: She is alert and oriented to person, place, and time.     Cranial Nerves: No cranial nerve deficit.     Deep Tendon Reflexes: Reflexes are normal and symmetric.  Psychiatric:        Behavior: Behavior normal.        Thought Content: Thought content normal.        Judgment:  Judgment normal.     BP (!) 158/87   Pulse 65   Temp (!) 96.9 F (36.1 C) (Temporal)   Ht $R'5\' 4"'Ez$  (1.626 m)   Wt 194 lb 6.4 oz (88.2 kg)   SpO2 96%   BMI 33.37 kg/m      Assessment & Plan:  MARIO VOONG comes in today with chief complaint of Medical Management of Chronic Issues and Foot Pain (Both heels. Patient would like ANA test both daughters have something.)   Diagnosis and orders addressed:  1. Gastroesophageal reflux disease without esophagitis - omeprazole (PRILOSEC) 10 MG capsule; Take 1 capsule (10 mg total) by mouth daily. As needed  Dispense: 90 capsule; Refill: 3 - CMP14+EGFR  2.  Hypothyroidism, unspecified type - EUTHYROX 88 MCG tablet; Take 1 tablet (88 mcg total) by mouth daily.  Dispense: 90 tablet; Refill: 2 - CMP14+EGFR - TSH  3. Dyslipidemia - CMP14+EGFR  4. Vitamin D deficiency - CMP14+EGFR  5. Calcaneal spur, unspecified laterality - CMP14+EGFR - Rheumatoid factor  6. Obesity (BMI 30-39.9) - CMP14+EGFR   Labs pending Health Maintenance reviewed Diet and exercise encouraged  Follow up plan: 6 months    Evelina Dun, FNP

## 2021-07-31 NOTE — Patient Instructions (Signed)
Heel Spur A heel spur is a bony growth that forms on the bottom of the heel bone (calcaneus). Heel spurs are common. They often cause inflammation in the plantar fascia, which is the band of tissue that connects the toe bones to the heel bone. When the plantar fascia is inflamed, it is called plantar fasciitis. This may cause pain on the bottom of the foot, near the heel. Many people with plantar fasciitis also have heel spurs. However, spurs are not the cause of plantar fasciitis pain. What are the causes? The exact cause of heel spurs is not known. They may be caused by: Pressure on the heel bone. Bands of tissues that connect muscle to bone (tendons) pulling on the heel bone. What increases the risk? You are more likely to develop this condition if you: Are older than age 40. Are overweight. Have wear-and-tear arthritis (osteoarthritis). Have plantar fascia inflammation. Participate in sports or activities that include a lot of running or jumping. Wear poorly fitted shoes. What are the signs or symptoms? Some people have no symptoms. If you do have symptoms, they may include: Pain in the bottom of your heel. Pain that is worse when you first get out of bed. Pain that gets worse after walking or standing. How is this diagnosed? This condition may be diagnosed based on: Your symptoms and medical history. A physical exam. A foot X-ray. How is this treated? Treatment for this condition depends on how much pain you have. Treatment options may include: Doing stretching exercises and losing weight, if necessary. Wearing specific shoes or inserts inside of shoes (orthotics) for comfort and support. Wearing splints on your feet while you sleep. Splints keep your feet in a position (usually a 90-degree angle) that should prevent and relieve the pain you feel when you first get out of bed. They also make stretching easier in the morning. Taking over-the-counter medicine to relieve pain, such as  NSAIDs. Using high-intensity sound waves to break up the heel spur (extracorporeal shock wave therapy). Getting steroid injections in your heel to reduce inflammation. Having surgery, if your heel spur causes long-term (chronic) pain. Follow these instructions at home: Activity Avoid activities that cause pain until you recover, or for as long as told by your health care provider. Do stretching exercises as told. Stretch before exercising or being physically active. Managing pain, stiffness, and swelling If directed, put ice on your foot. To do this: Put ice in a plastic bag. Place a towel between your skin and the bag. Leave the ice on for 20 minutes, 2-3 times a day. Remove the ice if your skin turns bright red. This is very important. If you cannot feel pain, heat, or cold, you have a greater risk of damage to the area. Move your toes often to reduce stiffness and swelling. When possible, raise (elevate) your foot above the level of your heart while you are sitting or lying down. General instructions Take over-the-counter and prescription medicines only as told by your health care provider. Wear supportive shoes that fit well. Wear splints, inserts, or orthotics as told by your health care provider. If recommended, work with your health care provider to lose weight. This can relieve pressure on your foot. Do not use any products that contain nicotine or tobacco, such as cigarettes, e-cigarettes, and chewing tobacco. These can delay bone healing. If you need help quitting, ask your health care provider. Keep all follow-up visits. This is important. Where to find more information American Academy of   Orthopaedic Surgeons: www.orthoinfo.aaos.org Contact a health care provider if: Your pain does not go away with treatment. Your pain gets worse. Summary A heel spur is a bony growth that forms on the bottom of the heel bone (calcaneus). Heel spurs often cause inflammation in the plantar  fascia, which is the band of tissue that connects the toes to the heel bone. This may cause pain on the bottom of the foot, near the heel. Doing stretching exercises, losing weight, wearing specific shoes or shoe inserts, wearing splints while you sleep, and taking pain medicine may ease the pain and stiffness. Other treatment options may include high-intensity sound waves to break up the heel spur, steroid injections, or surgery. This information is not intended to replace advice given to you by your health care provider. Make sure you discuss any questions you have with your health care provider. Document Revised: 04/11/2020 Document Reviewed: 04/11/2020 Elsevier Patient Education  2022 [employer] Inc[employer]  

## 2021-08-01 LAB — CMP14+EGFR
ALT: 12 IU/L (ref 0–32)
AST: 17 IU/L (ref 0–40)
Albumin/Globulin Ratio: 1.5 (ref 1.2–2.2)
Albumin: 4 g/dL (ref 3.7–4.7)
Alkaline Phosphatase: 98 IU/L (ref 44–121)
BUN/Creatinine Ratio: 18 (ref 12–28)
BUN: 14 mg/dL (ref 8–27)
Bilirubin Total: 0.4 mg/dL (ref 0.0–1.2)
CO2: 23 mmol/L (ref 20–29)
Calcium: 9.5 mg/dL (ref 8.7–10.3)
Chloride: 107 mmol/L — ABNORMAL HIGH (ref 96–106)
Creatinine, Ser: 0.76 mg/dL (ref 0.57–1.00)
Globulin, Total: 2.7 g/dL (ref 1.5–4.5)
Glucose: 97 mg/dL (ref 65–99)
Potassium: 4.9 mmol/L (ref 3.5–5.2)
Sodium: 145 mmol/L — ABNORMAL HIGH (ref 134–144)
Total Protein: 6.7 g/dL (ref 6.0–8.5)
eGFR: 82 mL/min/{1.73_m2} (ref >59)

## 2021-08-01 LAB — RHEUMATOID FACTOR: Rheumatoid fact SerPl-aCnc: <10 IU/mL (ref <14.0)

## 2021-08-01 LAB — TSH: TSH: 1.97 u[IU]/mL (ref 0.450–4.500)

## 2021-10-10 ENCOUNTER — Ambulatory Visit (INDEPENDENT_AMBULATORY_CARE_PROVIDER_SITE_OTHER): Payer: Medicare HMO

## 2021-10-10 VITALS — Ht 64.0 in | Wt 194.0 lb

## 2021-10-10 DIAGNOSIS — Z0000 Encounter for general adult medical examination without abnormal findings: Secondary | ICD-10-CM

## 2021-10-10 NOTE — Patient Instructions (Signed)
Diamond Wright , Thank you for taking time to come for your Medicare Wellness Visit. I appreciate your ongoing commitment to your health goals. Please review the following plan we discussed and let me know if I can assist you in the future.   Screening recommendations/referrals: Colonoscopy: Cologuard 10/02/2020 - Repeat in 3 years  Mammogram: Done 07/09/2021 - Repeat Bone Density: Done 02/22/2020 - Repeat every 2 years  Recommended yearly ophthalmology/optometry visit for glaucoma screening and checkup Recommended yearly dental visit for hygiene and checkup  Vaccinations: Influenza vaccine: Declined Pneumococcal vaccine: Declined Tdap vaccine: Declined Shingles vaccine: Declined   Covid-19:Declined  Advanced directives: Advance directive discussed with you today. Even though you declined this today, please call our office should you change your mind, and we can give you the proper paperwork for you to fill out.  Conditions/risks identified: Aim for 30 minutes of exercise or brisk walking each day, drink 6-8 glasses of water and eat lots of fruits and vegetables.   Next appointment: Follow up in one year for your annual wellness visit    Preventive Care 65 Years and Older, Female Preventive care refers to lifestyle choices and visits with your health care provider that can promote health and wellness. What does preventive care include? A yearly physical exam. This is also called an annual well check. Dental exams once or twice a year. Routine eye exams. Ask your health care provider how often you should have your eyes checked. Personal lifestyle choices, including: Daily care of your teeth and gums. Regular physical activity. Eating a healthy diet. Avoiding tobacco and drug use. Limiting alcohol use. Practicing safe sex. Taking low-dose aspirin every day. Taking vitamin and mineral supplements as recommended by your health care provider. What happens during an annual well check? The  services and screenings done by your health care provider during your annual well check will depend on your age, overall health, lifestyle risk factors, and family history of disease. Counseling  Your health care provider may ask you questions about your: Alcohol use. Tobacco use. Drug use. Emotional well-being. Home and relationship well-being. Sexual activity. Eating habits. History of falls. Memory and ability to understand (cognition). Work and work Astronomer. Reproductive health. Screening  You may have the following tests or measurements: Height, weight, and BMI. Blood pressure. Lipid and cholesterol levels. These may be checked every 5 years, or more frequently if you are over 74 years old. Skin check. Lung cancer screening. You may have this screening every year starting at age 74 if you have a 30-pack-year history of smoking and currently smoke or have quit within the past 15 years. Fecal occult blood test (FOBT) of the stool. You may have this test every year starting at age 74. Flexible sigmoidoscopy or colonoscopy. You may have a sigmoidoscopy every 5 years or a colonoscopy every 10 years starting at age 74. Hepatitis C blood test. Hepatitis B blood test. Sexually transmitted disease (STD) testing. Diabetes screening. This is done by checking your blood sugar (glucose) after you have not eaten for a while (fasting). You may have this done every 1-3 years. Bone density scan. This is done to screen for osteoporosis. You may have this done starting at age 74. Mammogram. This may be done every 1-2 years. Talk to your health care provider about how often you should have regular mammograms. Talk with your health care provider about your test results, treatment options, and if necessary, the need for more tests. Vaccines  Your health care provider may  recommend certain vaccines, such as: Influenza vaccine. This is recommended every year. Tetanus, diphtheria, and acellular  pertussis (Tdap, Td) vaccine. You may need a Td booster every 10 years. Zoster vaccine. You may need this after age 74. Pneumococcal 13-valent conjugate (PCV13) vaccine. One dose is recommended after age 74. Pneumococcal polysaccharide (PPSV23) vaccine. One dose is recommended after age 74. Talk to your health care provider about which screenings and vaccines you need and how often you need them. This information is not intended to replace advice given to you by your health care provider. Make sure you discuss any questions you have with your health care provider. Document Released: 01/12/2016 Document Revised: 09/04/2016 Document Reviewed: 10/17/2015 Elsevier Interactive Patient Education  2017 [place] Prevention in the Home Falls can cause injuries. They can happen to people of all ages. There are many things you can do to make your home safe and to help prevent falls. What can I do on the outside of my home? Regularly fix the edges of walkways and driveways and fix any cracks. Remove anything that might make you trip as you walk through a door, such as a raised step or threshold. Trim any bushes or trees on the path to your home. Use bright outdoor lighting. Clear any walking paths of anything that might make someone trip, such as rocks or tools. Regularly check to see if handrails are loose or broken. Make sure that both sides of any steps have handrails. Any raised decks and porches should have guardrails on the edges. Have any leaves, snow, or ice cleared regularly. Use sand or salt on walking paths during winter. Clean up any spills in your garage right away. This includes oil or grease spills. What can I do in the bathroom? Use night lights. Install grab bars by the toilet and in the tub and shower. Do not use towel bars as grab bars. Use non-skid mats or decals in the tub or shower. If you need to sit down in the shower, use a plastic, non-slip stool. Keep the floor  dry. Clean up any water that spills on the floor as soon as it happens. Remove soap buildup in the tub or shower regularly. Attach bath mats securely with double-sided non-slip rug tape. Do not have throw rugs and other things on the floor that can make you trip. What can I do in the bedroom? Use night lights. Make sure that you have a light by your bed that is easy to reach. Do not use any sheets or blankets that are too big for your bed. They should not hang down onto the floor. Have a firm chair that has side arms. You can use this for support while you get dressed. Do not have throw rugs and other things on the floor that can make you trip. What can I do in the kitchen? Clean up any spills right away. Avoid walking on wet floors. Keep items that you use a lot in easy-to-reach places. If you need to reach something above you, use a strong step stool that has a grab bar. Keep electrical cords out of the way. Do not use floor polish or wax that makes floors slippery. If you must use wax, use non-skid floor wax. Do not have throw rugs and other things on the floor that can make you trip. What can I do with my stairs? Do not leave any items on the stairs. Make sure that there are handrails on both sides of  the stairs and use them. Fix handrails that are broken or loose. Make sure that handrails are as long as the stairways. Check any carpeting to make sure that it is firmly attached to the stairs. Fix any carpet that is loose or worn. Avoid having throw rugs at the top or bottom of the stairs. If you do have throw rugs, attach them to the floor with carpet tape. Make sure that you have a light switch at the top of the stairs and the bottom of the stairs. If you do not have them, ask someone to add them for you. What else can I do to help prevent falls? Wear shoes that: Do not have high heels. Have rubber bottoms. Are comfortable and fit you well. Are closed at the toe. Do not wear  sandals. If you use a stepladder: Make sure that it is fully opened. Do not climb a closed stepladder. Make sure that both sides of the stepladder are locked into place. Ask someone to hold it for you, if possible. Clearly mark and make sure that you can see: Any grab bars or handrails. First and last steps. Where the edge of each step is. Use tools that help you move around (mobility aids) if they are needed. These include: Canes. Walkers. Scooters. Crutches. Turn on the lights when you go into a dark area. Replace any light bulbs as soon as they burn out. Set up your furniture so you have a clear path. Avoid moving your furniture around. If any of your floors are uneven, fix them. If there are any pets around you, be aware of where they are. Review your medicines with your doctor. Some medicines can make you feel dizzy. This can increase your chance of falling. Ask your doctor what other things that you can do to help prevent falls. This information is not intended to replace advice given to you by your health care provider. Make sure you discuss any questions you have with your health care provider. Document Released: 10/12/2009 Document Revised: 05/23/2016 Document Reviewed: 01/20/2015 Elsevier Interactive Patient Education  2017 [employer].

## 2021-10-10 NOTE — Progress Notes (Signed)
Subjective:   Diamond Wright is a 75 y.o. female who presents for Medicare Annual (Subsequent) preventive examination.  Virtual Visit via Telephone Note  I connected with  Diamond Wright on 10/10/21 at  4:15 PM EDT by telephone and verified that I am speaking with the correct person using two identifiers.  Location: Patient: Home Provider: [facility] Persons participating in the virtual visit: patient/Nurse Health Advisor   I discussed the limitations, risks, security and privacy concerns of performing an evaluation and management service by telephone and the availability of in person appointments. The patient expressed understanding and agreed to proceed.  Interactive audio and video telecommunications were attempted between this nurse and patient, however failed, due to patient having technical difficulties OR patient did not have access to video capability.  We continued and completed visit with audio only.  Some vital signs may be absent or patient reported.   Aamira Bischoff E Tamaka Sawin, LPN   Review of Systems     Cardiac Risk Factors include: advanced age (>41men, >39 women);dyslipidemia;sedentary lifestyle;obesity (BMI >30kg/m2)     Objective:    Today's Vitals   10/10/21 1548  Weight: 194 lb (88 kg)  Height: 5\' 4"  (1.626 m)   Body mass index is 33.3 kg/m.  Advanced Directives 10/10/2021 03/14/2020  Does Patient Have a Medical Advance Directive? No No  Would patient like information on creating a medical advance directive? No - Patient declined No - Patient declined    Current Medications (verified) Outpatient Encounter Medications as of 10/10/2021  Medication Sig   aspirin EC 81 MG tablet Take by mouth. 2-3 a week   atorvastatin (LIPITOR) 20 MG tablet Take 1 tablet (20 mg total) by mouth daily.   EUTHYROX 88 MCG tablet Take 1 tablet (88 mcg total) by mouth daily.   Magnesium 200 MG TABS Take by mouth in the morning and at bedtime.   meloxicam (MOBIC) 15 MG tablet Take 1  tablet (15 mg total) by mouth daily.   Multiple Vitamins-Minerals (HAIR SKIN AND NAILS FORMULA) TABS Take by mouth.   omeprazole (PRILOSEC) 10 MG capsule Take 1 capsule (10 mg total) by mouth daily. As needed   Vitamin D, Ergocalciferol, (DRISDOL) 1.25 MG (50000 UNIT) CAPS capsule Take 1 capsule (50,000 Units total) by mouth once a week.   No facility-administered encounter medications on file as of 10/10/2021.    Allergies (verified) Naproxen   History: Past Medical History:  Diagnosis Date   Ankle fracture    Bilateral   Cataract    GERD (gastroesophageal reflux disease)    Heel spur, right    Osteopenia    Thyroid disease    Vitamin D deficiency    Past Surgical History:  Procedure Laterality Date   ABDOMINAL HYSTERECTOMY     CATARACT EXTRACTION, BILATERAL     SPINE SURGERY     Family History  Problem Relation Age of Onset   Seizures Mother    Congestive Heart Failure Mother    Diabetes Father    Heart disease Father    Lupus Daughter    Ulcerative colitis Son    Diabetes Sister    Cancer Brother    Thyroid disease Sister    Prostate cancer Brother    Diabetes Brother    Diabetes Brother    Social History   Socioeconomic History   Marital status: Married    Spouse name: 12/10/2021   Number of children: 4   Years of education: Not on file  Highest education level: GED or equivalent  Occupational History   Occupation: Retired  Tobacco Use   Smoking status: Never   Smokeless tobacco: Never  Vaping Use   Vaping Use: Never used  Substance and Sexual Activity   Alcohol use: Never   Drug use: Never   Sexual activity: Not Currently  Other Topics Concern   Not on file  Social History Narrative   Lives in a one level home with her husband   Children live in [place], [place], [place], and [facility]   Social Determinants of Health   Financial Resource Strain: Low Risk    Difficulty of Paying Living Expenses: Not very hard  Food Insecurity: No Food  Insecurity   Worried About Programme researcher, broadcasting/film/video in the Last Year: Never true   Barista in the Last Year: Never true  Transportation Needs: No Transportation Needs   Lack of Transportation (Medical): No   Lack of Transportation (Non-Medical): No  Physical Activity: Inactive   Days of Exercise per Week: 0 days   Minutes of Exercise per Session: 0 min  Stress: No Stress Concern Present   Feeling of Stress : Only a little  Social Connections: Press photographer of Communication with Friends and Family: More than three times a week   Frequency of Social Gatherings with Friends and Family: More than three times a week   Attends Religious Services: More than 4 times per year   Active Member of [employer] or Organizations: Yes   Attends Engineer, structural: More than 4 times per year   Marital Status: Married    Tobacco Counseling Counseling given: Not Answered   Clinical Intake:                 Diabetic? no         Activities of Daily Living In your present state of health, do you have any difficulty performing the following activities: 10/10/2021  Hearing? N  Vision? N  Difficulty concentrating or making decisions? N  Walking or climbing stairs? N  Dressing or bathing? N  Doing errands, shopping? N  Preparing Food and eating ? N  Using the Toilet? N  In the past six months, have you accidently leaked urine? N  Do you have problems with loss of bowel control? N  Managing your Medications? N  Managing your Finances? N  Housekeeping or managing your Housekeeping? N  Some recent data might be hidden    Patient Care Team: Junie Spencer, FNP as PCP - General (Family Medicine)  Indicate any recent Medical Services you may have received from other than [facility] providers in the past year (date may be approximate).     Assessment:   This is a routine wellness examination for Diamond Wright.  Hearing/Vision screen Hearing Screening - Comments::  Denies hearing difficulties  Dietary issues and exercise activities discussed: Current Exercise Habits: The patient does not participate in regular exercise at present, Exercise limited by: orthopedic condition(s)   Goals Addressed             This Visit's Progress    Exercise 3x per week (30 min per time)   Not on track      Depression Screen PHQ 2/9 Scores 10/10/2021 07/31/2021 01/29/2021 08/08/2020 03/14/2020 02/09/2020 11/02/2019  PHQ - 2 Score 2 2 0 0 0 0 0  PHQ- 9 Score 2 2 - - - - -  Exception Documentation - Medical reason - - - - -  Fall Risk Fall Risk  10/10/2021 01/29/2021 08/08/2020 03/14/2020 02/09/2020  Falls in the past year? 0 0 0 1 0  Number falls in past yr: 0 - - 0 -  Injury with Fall? 0 - - 0 -  Risk for fall due to : Orthopedic patient - - - -  Follow up Falls prevention discussed - - Falls prevention discussed -    FALL RISK PREVENTION PERTAINING TO THE HOME:  Any stairs in or around the home? No  If so, are there any without handrails? No  Home free of loose throw rugs in walkways, pet beds, electrical cords, etc? Yes  Adequate lighting in your home to reduce risk of falls? Yes   ASSISTIVE DEVICES UTILIZED TO PREVENT FALLS:  Life alert? No  Use of a cane, walker or w/c? No  Grab bars in the bathroom? No  Shower chair or bench in shower? No  Elevated toilet seat or a handicapped toilet? No   TIMED UP AND GO:  Was the test performed? No . Telephonic visit  Cognitive Function: Normal cognitive status assessed by direct observation by this Nurse Health Advisor. No abnormalities found.       6CIT Screen 03/14/2020  What Year? 0 points  What month? 0 points  What time? 0 points  Count back from 20 0 points  Months in reverse 0 points  Repeat phrase 0 points  Total Score 0    Immunizations  There is no immunization history on file for this patient.  TDAP status: Due, Education has been provided regarding the importance of this vaccine. Advised  may receive this vaccine at local pharmacy or Health Dept. Aware to provide a copy of the vaccination record if obtained from local pharmacy or Health Dept. Verbalized acceptance and understanding.  Flu Vaccine status: Declined, Education has been provided regarding the importance of this vaccine but patient still declined. Advised may receive this vaccine at local pharmacy or Health Dept. Aware to provide a copy of the vaccination record if obtained from local pharmacy or Health Dept. Verbalized acceptance and understanding.  Pneumococcal vaccine status: Declined,  Education has been provided regarding the importance of this vaccine but patient still declined. Advised may receive this vaccine at local pharmacy or Health Dept. Aware to provide a copy of the vaccination record if obtained from local pharmacy or Health Dept. Verbalized acceptance and understanding.   Covid-19 vaccine status: Declined, Education has been provided regarding the importance of this vaccine but patient still declined. Advised may receive this vaccine at local pharmacy or Health Dept.or vaccine clinic. Aware to provide a copy of the vaccination record if obtained from local pharmacy or Health Dept. Verbalized acceptance and understanding.  Qualifies for Shingles Vaccine? Yes   Zostavax completed No   Shingrix Completed?: No.    Education has been provided regarding the importance of this vaccine. Patient has been advised to call insurance company to determine out of pocket expense if they have not yet received this vaccine. Advised may also receive vaccine at local pharmacy or Health Dept. Verbalized acceptance and understanding.  Screening Tests Health Maintenance  Topic Date Due   COVID-19 Vaccine (1) Never done   Zoster Vaccines- Shingrix (1 of 2) 10/31/2021 (Originally 02/04/1997)   INFLUENZA VACCINE  03/29/2022 (Originally 07/30/2021)   TETANUS/TDAP  07/31/2022 (Originally 02/04/1966)   DEXA SCAN  02/21/2022   MAMMOGRAM   07/09/2022   Fecal DNA (Cologuard)  10/03/2023   Hepatitis C Screening  Completed  HPV VACCINES  Aged Out    Health Maintenance  Health Maintenance Due  Topic Date Due   COVID-19 Vaccine (1) Never done    Colorectal cancer screening: Type of screening: Cologuard. Completed 10/02/2020. Repeat every 3 years  Mammogram status: Completed 07/09/2021. Repeat every year - repeat was ordered for abnormal screening 07/13/21   Bone Density status: Completed 02/22/2020. Results reflect: Bone density results: OSTEOPENIA. Repeat every 2 years.  Lung Cancer Screening: (Low Dose CT Chest recommended if Age 59-80 years, 30 pack-year currently smoking OR have quit w/in 15years.) does not qualify.   Additional Screening:  Hepatitis C Screening: does qualify; Completed 10/02/2020  Vision Screening: Recommended annual ophthalmology exams for early detection of glaucoma and other disorders of the eye. Is the patient up to date with their annual eye exam?  No  Who is the provider or what is the name of the office in which the patient attends annual eye exams? [place] If pt is not established with a provider, would they like to be referred to a provider to establish care? No .   Dental Screening: Recommended annual dental exams for proper oral hygiene  Community Resource Referral / Chronic Care Management: CRR required this visit?  No   CCM required this visit?  No      Plan:     I have personally reviewed and noted the following in the patient's chart:   Medical and social history Use of alcohol, tobacco or illicit drugs  Current medications and supplements including opioid prescriptions.  Functional ability and status Nutritional status Physical activity Advanced directives List of other physicians Hospitalizations, surgeries, and ER visits in previous 12 months Vitals Screenings to include cognitive, depression, and falls Referrals and appointments  In addition, I have reviewed  and discussed with patient certain preventive protocols, quality metrics, and best practice recommendations. A written personalized care plan for preventive services as well as general preventive health recommendations were provided to patient.     [place] Constable, LPN   86/76/1949   Nurse Notes: None

## 2021-11-15 ENCOUNTER — Ambulatory Visit (INDEPENDENT_AMBULATORY_CARE_PROVIDER_SITE_OTHER): Payer: Medicare HMO | Admitting: Family Medicine

## 2021-11-15 ENCOUNTER — Encounter: Payer: Self-pay | Admitting: Family Medicine

## 2021-11-15 DIAGNOSIS — R197 Diarrhea, unspecified: Secondary | ICD-10-CM | POA: Diagnosis not present

## 2021-11-15 DIAGNOSIS — K219 Gastro-esophageal reflux disease without esophagitis: Secondary | ICD-10-CM | POA: Diagnosis not present

## 2021-11-15 MED ORDER — LOPERAMIDE HCL 2 MG PO TABS: ORAL_TABLET | ORAL | 0 refills | Status: DC

## 2021-11-15 MED ORDER — FAMOTIDINE 20 MG PO TABS: 20.0000 mg | ORAL_TABLET | Freq: Two times a day (BID) | ORAL | 0 refills | Status: DC

## 2021-11-15 NOTE — Progress Notes (Signed)
   Virtual Visit  Note Due to COVID-19 pandemic this visit was conducted virtually. This visit type was conducted due to national recommendations for restrictions regarding the COVID-19 Pandemic (e.g. social distancing, sheltering in place) in an effort to limit this patient's exposure and mitigate transmission in our community. All issues noted in this document were discussed and addressed.  A physical exam was not performed with this format.  I connected with Diamond Wright on 11/15/21 at 1315 by telephone and verified that I am speaking with the correct person using two identifiers. Diamond Wright is currently located at home and no one is currently with her during the visit. The provider, Gabriel Earing, FNP is located in their office at time of visit.  I discussed the limitations, risks, security and privacy concerns of performing an evaluation and management service by telephone and the availability of in person appointments. I also discussed with the patient that there may be a patient responsible charge related to this service. The patient expressed understanding and agreed to proceed.  CC: diarrhea  History and Present Illness:  HPI Diamond Wright reports diarrhea since yesterday. She had 7-8 episodes of diarrhea over night. Since waking this morning she has only had 1 episode of diarrhea. She also reports a burning pain in her RUQ for the last few days. She does have a history of GERD and takes omeprazole for this. She denies vomiting, nausea, fever, body aches, chills, cough, or congestion. She has had some small bites of bland foods. She has been staying well hydrated.     ROS As per HPI.   Observations/Objective: Alert and oriented x 3. Able to speak in full sentences without difficulty.   Assessment and Plan: Diamond Wright was seen today for diarrhea.  Diagnoses and all orders for this visit:  Diarrhea, unspecified type Discussed imodium, hydration, bland diet.   -     loperamide  (IMODIUM A-D) 2 MG tablet; Take 4 mg after first loose stool, then take 2 mg after each loose stool. Do not take more than 8 tablets in 24 tablets. Do not take for more than 3 days.  Gastroesophageal reflux disease without esophagitis Continue omeprazole, add pepcid until pain improves.  -     famotidine (PEPCID) 20 MG tablet; Take 1 tablet (20 mg total) by mouth 2 (two) times daily.   Follow Up Instructions: Return to office for new or worsening symptoms, or if symptoms persist.     I discussed the assessment and treatment plan with the patient. The patient was provided an opportunity to ask questions and all were answered. The patient agreed with the plan and demonstrated an understanding of the instructions.   The patient was advised to call back or seek an in-person evaluation if the symptoms worsen or if the condition fails to improve as anticipated.  The above assessment and management plan was discussed with the patient. The patient verbalized understanding of and has agreed to the management plan. Patient is aware to call the clinic if symptoms persist or worsen. Patient is aware when to return to the clinic for a follow-up visit. Patient educated on when it is appropriate to go to the emergency department.   Time call ended:  1328  I provided 13 minutes of  non face-to-face time during this encounter.    Gabriel Earing, FNP

## 2021-12-03 ENCOUNTER — Telehealth: Payer: Self-pay

## 2021-12-03 NOTE — Telephone Encounter (Signed)
Based on recent screening mammogram patient needs additional imaging. I have attempted to contact the patient several time via phone and mail and have not been able to reach the patient or get her to contact the office to set up this appointment.

## 2022-01-07 ENCOUNTER — Telehealth: Payer: Self-pay | Admitting: Family

## 2022-01-31 ENCOUNTER — Ambulatory Visit: Payer: Medicare HMO | Admitting: Family

## 2022-02-05 ENCOUNTER — Ambulatory Visit (INDEPENDENT_AMBULATORY_CARE_PROVIDER_SITE_OTHER): Payer: Medicare HMO | Admitting: Family

## 2022-02-05 ENCOUNTER — Encounter: Payer: Self-pay | Admitting: Family

## 2022-02-05 VITALS — BP 134/77 | HR 69 | Temp 97.1°F | Ht 64.0 in | Wt 189.2 lb

## 2022-02-05 DIAGNOSIS — E559 Vitamin D deficiency, unspecified: Secondary | ICD-10-CM

## 2022-02-05 DIAGNOSIS — E785 Hyperlipidemia, unspecified: Secondary | ICD-10-CM

## 2022-02-05 DIAGNOSIS — E669 Obesity, unspecified: Secondary | ICD-10-CM | POA: Diagnosis not present

## 2022-02-05 DIAGNOSIS — M7730 Calcaneal spur, unspecified foot: Secondary | ICD-10-CM | POA: Diagnosis not present

## 2022-02-05 DIAGNOSIS — K219 Gastro-esophageal reflux disease without esophagitis: Secondary | ICD-10-CM | POA: Diagnosis not present

## 2022-02-05 DIAGNOSIS — M67971 Unspecified disorder of synovium and tendon, right ankle and foot: Secondary | ICD-10-CM | POA: Diagnosis not present

## 2022-02-05 DIAGNOSIS — Z0000 Encounter for general adult medical examination without abnormal findings: Secondary | ICD-10-CM

## 2022-02-05 DIAGNOSIS — Z0001 Encounter for general adult medical examination with abnormal findings: Secondary | ICD-10-CM

## 2022-02-05 DIAGNOSIS — E039 Hypothyroidism, unspecified: Secondary | ICD-10-CM

## 2022-02-05 MED ORDER — OMEPRAZOLE 20 MG PO CPDR: 20.0000 mg | DELAYED_RELEASE_CAPSULE | Freq: Every day | ORAL | 1 refills | Status: DC

## 2022-02-05 MED ORDER — MELOXICAM 15 MG PO TABS: 15.0000 mg | ORAL_TABLET | Freq: Every day | ORAL | 1 refills | Status: DC

## 2022-02-05 NOTE — Patient Instructions (Signed)
Health Maintenance After Age 75 After age 75, you are at a higher risk for certain long-term diseases and infections as well as injuries from falls. Falls are a major cause of broken bones and head injuries in people who are older than age 75. Getting regular preventive care can help to keep you healthy and well. Preventive care includes getting regular testing and making lifestyle changes as recommended by your health care provider. Talk with your health care provider about: Which screenings and tests you should have. A screening is a test that checks for a disease when you have no symptoms. A diet and exercise plan that is right for you. What should I know about screenings and tests to prevent falls? Screening and testing are the best ways to find a health problem early. Early diagnosis and treatment give you the best chance of managing medical conditions that are common after age 75. Certain conditions and lifestyle choices may make you more likely to have a fall. Your health care provider may recommend: Regular vision checks. Poor vision and conditions such as cataracts can make you more likely to have a fall. If you wear glasses, make sure to get your prescription updated if your vision changes. Medicine review. Work with your health care provider to regularly review all of the medicines you are taking, including over-the-counter medicines. Ask your health care provider about any side effects that may make you more likely to have a fall. Tell your health care provider if any medicines that you take make you feel dizzy or sleepy. Strength and balance checks. Your health care provider may recommend certain tests to check your strength and balance while standing, walking, or changing positions. Foot health exam. Foot pain and numbness, as well as not wearing proper footwear, can make you more likely to have a fall. Screenings, including: Osteoporosis screening. Osteoporosis is a condition that causes  the bones to get weaker and break more easily. Blood pressure screening. Blood pressure changes and medicines to control blood pressure can make you feel dizzy. Depression screening. You may be more likely to have a fall if you have a fear of falling, feel depressed, or feel unable to do activities that you used to do. Alcohol use screening. Using too much alcohol can affect your balance and may make you more likely to have a fall. Follow these instructions at home: Lifestyle Do not drink alcohol if: Your health care provider tells you not to drink. If you drink alcohol: Limit how much you have to: 0-1 drink a day for women. 0-2 drinks a day for men. Know how much alcohol is in your drink. In the [place] one drink equals one 12 oz bottle of beer (355 mL), one 5 oz glass of wine (148 mL), or one 1 oz glass of hard liquor (44 mL). Do not use any products that contain nicotine or tobacco. These products include cigarettes, chewing tobacco, and vaping devices, such as e-cigarettes. If you need help quitting, ask your health care provider. Activity  Follow a regular exercise program to stay fit. This will help you maintain your balance. Ask your health care provider what types of exercise are appropriate for you. If you need a cane or walker, use it as recommended by your health care provider. Wear supportive shoes that have nonskid soles. Safety  Remove any tripping hazards, such as rugs, cords, and clutter. Install safety equipment such as grab bars in bathrooms and safety rails on stairs. Keep rooms and walkways   well-lit. General instructions Talk with your health care provider about your risks for falling. Tell your health care provider if: You fall. Be sure to tell your health care provider about all falls, even ones that seem minor. You feel dizzy, tiredness (fatigue), or off-balance. Take over-the-counter and prescription medicines only as told by your health care provider. These include  supplements. Eat a healthy diet and maintain a healthy weight. A healthy diet includes low-fat dairy products, low-fat (lean) meats, and fiber from whole grains, beans, and lots of fruits and vegetables. Stay current with your vaccines. Schedule regular health, dental, and eye exams. Summary Having a healthy lifestyle and getting preventive care can help to protect your health and wellness after age 75. Screening and testing are the best way to find a health problem early and help you avoid having a fall. Early diagnosis and treatment give you the best chance for managing medical conditions that are more common for people who are older than age 75. Falls are a major cause of broken bones and head injuries in people who are older than age 75. Take precautions to prevent a fall at home. Work with your health care provider to learn what changes you can make to improve your health and wellness and to prevent falls. This information is not intended to replace advice given to you by your health care provider. Make sure you discuss any questions you have with your health care provider. Document Revised: 05/07/2021 Document Reviewed: 05/07/2021 Elsevier Patient Education  2022 [employer]  

## 2022-02-05 NOTE — Progress Notes (Addendum)
Subjective:    Patient ID: Diamond Wright, female    DOB: 1946/01/26, 76 y.o.   MRN: [id number]  Chief Complaint  Patient presents with   Medical Management of Chronic Issues    Statin causing leg pain   Pt presents to the office today for CPE and  chronic follow up. She is followed by Podiatry every 6 months for bilateral heel spurs. She takes Mobic 15 mg for this that helps. Gastroesophageal Reflux She complains of belching and heartburn. This is a chronic problem. The current episode started more than 1 year ago. The problem occurs occasionally. The problem has been waxing and waning. Associated symptoms include fatigue. Risk factors include obesity. She has tried a PPI for the symptoms. The treatment provided moderate relief.  Thyroid Problem Presents for follow-up visit. Symptoms include fatigue. Patient reports no cold intolerance, depressed mood or dry skin. The symptoms have been stable. Her past medical history is significant for hyperlipidemia.  Hyperlipidemia This is a chronic problem. The current episode started more than 1 year ago. The problem is controlled. Recent lipid tests were reviewed and are normal. Exacerbating diseases include obesity. Current antihyperlipidemic treatment includes statins. The current treatment provides moderate improvement of lipids. Risk factors for coronary artery disease include dyslipidemia and hypertension.     Review of Systems  Constitutional:  Positive for fatigue.  Gastrointestinal:  Positive for heartburn.  Endocrine: Negative for cold intolerance.  All other systems reviewed and are negative.  Family History  Problem Relation Age of Onset   Seizures Mother    Congestive Heart Failure Mother    Diabetes Father    Heart disease Father    Lupus Daughter    Ulcerative colitis Son    Diabetes Sister    Cancer Brother    Thyroid disease Sister    Prostate cancer Brother    Diabetes Brother    Diabetes Brother    Social History    Socioeconomic History   Marital status: Married    Spouse name: Truddie Crumble   Number of children: 4   Years of education: Not on file   Highest education level: GED or equivalent  Occupational History   Occupation: Retired  Tobacco Use   Smoking status: Never   Smokeless tobacco: Never  Vaping Use   Vaping Use: Never used  Substance and Sexual Activity   Alcohol use: Never   Drug use: Never   Sexual activity: Not Currently  Other Topics Concern   Not on file  Social History Narrative   Lives in a one level home with her husband   Children live in [place], [place], [place], and [place]   Social Determinants of Health   Financial Resource Strain: Low Risk    Difficulty of Paying Living Expenses: Not very hard  Food Insecurity: No Food Insecurity   Worried About Charity fundraiser in the Last Year: Never true   Arboriculturist in the Last Year: Never true  Transportation Needs: No Transportation Needs   Lack of Transportation (Medical): No   Lack of Transportation (Non-Medical): No  Physical Activity: Sufficiently Active   Days of Exercise per Week: 7 days   Minutes of Exercise per Session: 40 min  Stress: No Stress Concern Present   Feeling of Stress : Only a little  Social Connections: Engineer, building services of Communication with Friends and Family: More than three times a week   Frequency of Social Gatherings with Friends and  Family: More than three times a week   Attends Religious Services: More than 4 times per year   Active Member of Clubs or Organizations: Yes   Attends Archivist Meetings: More than 4 times per year   Marital Status: Married       Objective:   Physical Exam Vitals reviewed.  Constitutional:      General: She is not in acute distress.    Appearance: She is well-developed.  HENT:     Head: Normocephalic and atraumatic.     Right Ear: Tympanic membrane normal.     Left Ear: Tympanic membrane normal.  Eyes:      Pupils: Pupils are equal, round, and reactive to light.  Neck:     Thyroid: No thyromegaly.  Cardiovascular:     Rate and Rhythm: Normal rate and regular rhythm.     Heart sounds: Normal heart sounds. No murmur heard. Pulmonary:     Effort: Pulmonary effort is normal. No respiratory distress.     Breath sounds: Normal breath sounds. No wheezing.  Abdominal:     General: Bowel sounds are normal. There is no distension.     Palpations: Abdomen is soft.     Tenderness: There is no abdominal tenderness.  Musculoskeletal:        General: No tenderness. Normal range of motion.     Cervical back: Normal range of motion and neck supple.  Skin:    General: Skin is warm and dry.  Neurological:     Mental Status: She is alert and oriented to person, place, and time.     Cranial Nerves: No cranial nerve deficit.     Deep Tendon Reflexes: Reflexes are normal and symmetric.  Psychiatric:        Behavior: Behavior normal.        Thought Content: Thought content normal.        Judgment: Judgment normal.      BP 134/77    Pulse 69    Temp (!) 97.1 F (36.2 C) (Temporal)    Ht $R'5\' 4"'aJ$  (1.626 m)    Wt 189 lb 3.2 oz (85.8 kg)    BMI 32.48 kg/m      Assessment & Plan:  ZOILA DITULLIO comes in today with chief complaint of Medical Management of Chronic Issues (Statin causing leg pain)   Diagnosis and orders addressed:  1. Achilles tendon disorder, right - meloxicam (MOBIC) 15 MG tablet; Take 1 tablet (15 mg total) by mouth daily.  Dispense: 90 tablet; Refill: 1 - CMP14+EGFR - CBC with Differential/Platelet  2. Gastroesophageal reflux disease without esophagitis -Omeprazole increased to 20 mg from 10 mg - CMP14+EGFR - CBC with Differential/Platelet  3. Hypothyroidism, unspecified type - CMP14+EGFR - CBC with Differential/Platelet - TSH  4. Dyslipidemia - CMP14+EGFR - CBC with Differential/Platelet - Lipid panel  5. Vitamin D deficiency - CMP14+EGFR - CBC with  Differential/Platelet - VITAMIN D 25 Hydroxy (Vit-D Deficiency, Fractures)  6. Obesity (BMI 30-39.9) - CMP14+EGFR - CBC with Differential/Platelet  7. Calcaneal spur, unspecified laterality - CMP14+EGFR - CBC with Differential/Platelet  8. Annual physical exam - CMP14+EGFR - CBC with Differential/Platelet - TSH - Lipid panel - VITAMIN D 25 Hydroxy (Vit-D Deficiency, Fractures)   Labs pending Health Maintenance reviewed Diet and exercise encouraged  Follow up plan: 6 months    Evelina Dun, FNP

## 2022-02-05 NOTE — Addendum Note (Signed)
Addended by: Jannifer Rodney A on: 02/05/2022 11:00 AM   Modules accepted: Orders

## 2022-02-06 LAB — CMP14+EGFR
ALT: 11 IU/L (ref 0–32)
AST: 15 IU/L (ref 0–40)
Albumin/Globulin Ratio: 1.6 (ref 1.2–2.2)
Albumin: 4 g/dL (ref 3.7–4.7)
Alkaline Phosphatase: 118 IU/L (ref 44–121)
BUN/Creatinine Ratio: 13 (ref 12–28)
BUN: 10 mg/dL (ref 8–27)
Bilirubin Total: 0.2 mg/dL (ref 0.0–1.2)
CO2: 25 mmol/L (ref 20–29)
Calcium: 9.2 mg/dL (ref 8.7–10.3)
Chloride: 107 mmol/L — ABNORMAL HIGH (ref 96–106)
Creatinine, Ser: 0.75 mg/dL (ref 0.57–1.00)
Globulin, Total: 2.5 g/dL (ref 1.5–4.5)
Glucose: 94 mg/dL (ref 70–99)
Potassium: 3.9 mmol/L (ref 3.5–5.2)
Sodium: 143 mmol/L (ref 134–144)
Total Protein: 6.5 g/dL (ref 6.0–8.5)
eGFR: 83 mL/min/{1.73_m2} (ref >59)

## 2022-02-06 LAB — CBC WITH DIFFERENTIAL/PLATELET
Basophils Absolute: 0.1 10*3/uL (ref 0.0–0.2)
Basos: 1 %
EOS (ABSOLUTE): 0.2 10*3/uL (ref 0.0–0.4)
Eos: 3 %
Hematocrit: 40.7 % (ref 34.0–46.6)
Hemoglobin: 13.8 g/dL (ref 11.1–15.9)
Immature Grans (Abs): 0 10*3/uL (ref 0.0–0.1)
Immature Granulocytes: 0 %
Lymphocytes Absolute: 2.2 10*3/uL (ref 0.7–3.1)
Lymphs: 27 %
MCH: 30.9 pg (ref 26.6–33.0)
MCHC: 33.9 g/dL (ref 31.5–35.7)
MCV: 91 fL (ref 79–97)
Monocytes Absolute: 0.6 10*3/uL (ref 0.1–0.9)
Monocytes: 7 %
Neutrophils Absolute: 5 10*3/uL (ref 1.4–7.0)
Neutrophils: 62 %
Platelets: 192 10*3/uL (ref 150–450)
RBC: 4.46 x10E6/uL (ref 3.77–5.28)
RDW: 12.3 % (ref 11.7–15.4)
WBC: 8.1 10*3/uL (ref 3.4–10.8)

## 2022-02-06 LAB — LIPID PANEL
Chol/HDL Ratio: 3.5 ratio (ref 0.0–4.4)
Cholesterol, Total: 109 mg/dL (ref 100–199)
HDL: 31 mg/dL — ABNORMAL LOW (ref >39)
LDL Chol Calc (NIH): 46 mg/dL (ref 0–99)
Triglycerides: 197 mg/dL — ABNORMAL HIGH (ref 0–149)
VLDL Cholesterol Cal: 32 mg/dL (ref 5–40)

## 2022-02-06 LAB — VITAMIN D 25 HYDROXY (VIT D DEFICIENCY, FRACTURES): Vit D, 25-Hydroxy: 79 ng/mL (ref 30.0–100.0)

## 2022-02-06 LAB — TSH: TSH: 1.09 u[IU]/mL (ref 0.450–4.500)

## 2022-02-19 ENCOUNTER — Other Ambulatory Visit: Payer: Self-pay | Admitting: Family

## 2022-02-19 DIAGNOSIS — E559 Vitamin D deficiency, unspecified: Secondary | ICD-10-CM

## 2022-03-20 DIAGNOSIS — R928 Other abnormal and inconclusive findings on diagnostic imaging of breast: Secondary | ICD-10-CM | POA: Diagnosis not present

## 2022-03-20 DIAGNOSIS — R921 Mammographic calcification found on diagnostic imaging of breast: Secondary | ICD-10-CM | POA: Diagnosis not present

## 2022-03-26 ENCOUNTER — Other Ambulatory Visit: Payer: Self-pay | Admitting: Family

## 2022-03-26 DIAGNOSIS — E559 Vitamin D deficiency, unspecified: Secondary | ICD-10-CM

## 2022-07-15 ENCOUNTER — Other Ambulatory Visit: Payer: Self-pay | Admitting: Family

## 2022-07-15 DIAGNOSIS — E039 Hypothyroidism, unspecified: Secondary | ICD-10-CM

## 2022-08-05 ENCOUNTER — Ambulatory Visit (INDEPENDENT_AMBULATORY_CARE_PROVIDER_SITE_OTHER): Payer: Medicare HMO | Admitting: Family

## 2022-08-05 ENCOUNTER — Other Ambulatory Visit: Payer: Self-pay | Admitting: Family

## 2022-08-05 ENCOUNTER — Encounter: Payer: Self-pay | Admitting: Family

## 2022-08-05 VITALS — BP 133/74 | HR 61 | Temp 97.4°F | Ht 64.0 in | Wt 190.6 lb

## 2022-08-05 DIAGNOSIS — E039 Hypothyroidism, unspecified: Secondary | ICD-10-CM

## 2022-08-05 DIAGNOSIS — E669 Obesity, unspecified: Secondary | ICD-10-CM | POA: Diagnosis not present

## 2022-08-05 DIAGNOSIS — K219 Gastro-esophageal reflux disease without esophagitis: Secondary | ICD-10-CM

## 2022-08-05 DIAGNOSIS — E785 Hyperlipidemia, unspecified: Secondary | ICD-10-CM

## 2022-08-05 DIAGNOSIS — E559 Vitamin D deficiency, unspecified: Secondary | ICD-10-CM | POA: Diagnosis not present

## 2022-08-05 NOTE — Patient Instructions (Signed)
Vitamin D Deficiency Vitamin D deficiency is when your body does not have enough vitamin D. Vitamin D is important to your body because: It helps the body maintain calcium and phosphorus levels. These are important minerals. It plays a role in bone health. It reduces inflammation. It improves the body's defense system (immune system). If vitamin D deficiency is severe, it can cause a condition in which your bones become soft. In adults, this condition is called osteomalacia. In children, this condition is called rickets. What are the causes? This condition may be caused by: Not eating enough foods that contain vitamin D. Not getting enough natural sun exposure. Having certain digestive system diseases that make it difficult for your body to absorb vitamin D. These diseases include Crohn's disease, long-term (chronic) pancreatitis, and cystic fibrosis. Having had a surgery in which a part of the stomach or a part of the small intestine was removed. What increases the risk? You are more likely to develop this condition if you: Are an older adult. Do not spend much time outdoors. Live in a long-term care facility. Have dark skin. Take certain medicines, such as steroid medicines or certain seizure medicines. Are overweight or obese. Have chronic kidney or liver disease. What are the signs or symptoms? In mild cases of vitamin D deficiency, there may not be any symptoms. If the condition is severe, symptoms may include: Bone pain. Muscle pain. Not being able to walk normally (abnormal gait). Broken bones caused by a minor injury. Joint pain. How is this diagnosed? This condition may be diagnosed with blood tests. Imaging tests such as X-rays may also be done to look for changes in the bone. How is this treated? Treatment may include taking supplements as told by your health care provider. Your health care provider will tell you what dose is best for you. Supplements may include: Vitamin  D. Calcium. Follow these instructions at home: Eating and drinking Eat foods that contain vitamin D, such as: Dairy products, cereals, or juices that have vitamin D added to them (are fortified). Check the label. Fish, such as salmon or trout. Eggs. The vitamin D is in the yolk. Mushrooms that were treated with UV light. Beef liver. The items listed above may not be a complete list of foods and beverages you can eat and drink. Contact a dietitian for more information. General instructions Take over-the-counter and prescription medicines only as told by your health care provider. Take supplements only as told by your health care provider. Get regular, safe exposure to natural sunlight. Do not use a tanning bed. Maintain a healthy weight. Lose weight if needed. Keep all follow-up visits. This is important. How is this prevented? You can get vitamin D by: Eating foods that naturally contain vitamin D. Eating or drinking products that have been fortified with vitamin D, such as cereals, juices, and dairy products, including milk. Taking a vitamin D supplement or a multivitamin that contains vitamin D. Being in the sun. Your body naturally makes vitamin D when your skin is exposed to sunlight. Your body changes the sunlight into a form of the vitamin that it can use. Contact a health care provider if: Your symptoms do not go away. You feel nauseous or you vomit. You have fewer bowel movements than usual or are constipated. Summary Vitamin D deficiency is when your body does not have enough vitamin D. Vitamin D helps to keep your bones healthy. Vitamin D deficiency is primarily treated by taking supplements. Your health care   provider will suggest what dose is best for you. You can get vitamin D by eating foods that contain vitamin D, by being in the sun, and by taking a vitamin D supplement or a multivitamin that contains vitamin D. This information is not intended to replace advice given  to you by your health care provider. Make sure you discuss any questions you have with your health care provider. Document Revised: 09/21/2021 Document Reviewed: 09/21/2021 Elsevier Patient Education  2023 Elsevier Inc.  

## 2022-08-05 NOTE — Progress Notes (Signed)
Subjective:    Patient ID: Diamond Wright, female    DOB: [birth year], 76 y.o.   MRN: [id number]  Chief Complaint  Patient presents with   Medical Management of Chronic Issues   Pt presents to the office today for  chronic follow up. She is followed by Podiatry as needed for bilateral heel spurs. She takes Mobic 15 mg for this that helps. Gastroesophageal Reflux She complains of belching and heartburn. She reports no hoarse voice. This is a chronic problem. The current episode started more than 1 year ago. The problem occurs occasionally. The problem has been waxing and waning. Associated symptoms include fatigue. Risk factors include obesity. She has tried a PPI for the symptoms. The treatment provided moderate relief.  Thyroid Problem Presents for follow-up visit. Symptoms include fatigue. Patient reports no constipation, depressed mood, dry skin, hair loss or hoarse voice. The symptoms have been stable. Her past medical history is significant for hyperlipidemia.  Hyperlipidemia This is a chronic problem. The current episode started more than 1 year ago. The problem is controlled. Recent lipid tests were reviewed and are normal. Current antihyperlipidemic treatment includes statins. The current treatment provides moderate improvement of lipids. Risk factors for coronary artery disease include dyslipidemia, hypertension and a sedentary lifestyle.      Review of Systems  Constitutional:  Positive for fatigue.  HENT:  Negative for hoarse voice.   Gastrointestinal:  Positive for heartburn. Negative for constipation.  All other systems reviewed and are negative.      Objective:   Physical Exam Vitals reviewed.  Constitutional:      General: She is not in acute distress.    Appearance: She is well-developed.  HENT:     Head: Normocephalic and atraumatic.     Right Ear: Tympanic membrane normal.     Left Ear: Tympanic membrane normal.  Eyes:     Pupils: Pupils are equal, round, and  reactive to light.  Neck:     Thyroid: No thyromegaly.  Cardiovascular:     Rate and Rhythm: Normal rate and regular rhythm.     Heart sounds: Normal heart sounds. No murmur heard. Pulmonary:     Effort: Pulmonary effort is normal. No respiratory distress.     Breath sounds: Normal breath sounds. No wheezing.  Abdominal:     General: Bowel sounds are normal. There is no distension.     Palpations: Abdomen is soft.     Tenderness: There is no abdominal tenderness.  Musculoskeletal:        General: No tenderness. Normal range of motion.     Cervical back: Normal range of motion and neck supple.     Right lower leg: Edema (trace in ankle) present.     Left lower leg: Edema (trace in ankle) present.  Skin:    General: Skin is warm and dry.  Neurological:     Mental Status: She is alert and oriented to person, place, and time.     Cranial Nerves: No cranial nerve deficit.     Deep Tendon Reflexes: Reflexes are normal and symmetric.  Psychiatric:        Behavior: Behavior normal.        Thought Content: Thought content normal.        Judgment: Judgment normal.       BP 133/74   Pulse 61   Temp (!) 97.4 F (36.3 C) (Temporal)   Ht 5\' 4"  (1.626 m)   Wt 190 lb 9.6 oz (86.5  kg)   SpO2 98%   BMI 32.72 kg/m      Assessment & Plan:  LOYD SALVADOR comes in today with chief complaint of Medical Management of Chronic Issues   Diagnosis and orders addressed:  1. Gastroesophageal reflux disease without esophagitis - CMP14+EGFR - CBC with Differential/Platelet  2. Hypothyroidism, unspecified type - CMP14+EGFR - CBC with Differential/Platelet  3. Dyslipidemia - CMP14+EGFR - CBC with Differential/Platelet  4. Obesity (BMI 30-39.9) - CMP14+EGFR - CBC with Differential/Platelet  5. Vitamin D deficiency - CMP14+EGFR - CBC with Differential/Platelet   Labs pending Health Maintenance reviewed Diet and exercise encouraged  Follow up plan: 6 months   Evelina Dun,  FNP

## 2022-08-06 LAB — CMP14+EGFR
ALT: 14 IU/L (ref 0–32)
AST: 18 IU/L (ref 0–40)
Albumin/Globulin Ratio: 1.5 (ref 1.2–2.2)
Albumin: 4 g/dL (ref 3.8–4.8)
Alkaline Phosphatase: 114 IU/L (ref 44–121)
BUN/Creatinine Ratio: 17 (ref 12–28)
BUN: 14 mg/dL (ref 8–27)
Bilirubin Total: 0.5 mg/dL (ref 0.0–1.2)
CO2: 22 mmol/L (ref 20–29)
Calcium: 9.6 mg/dL (ref 8.7–10.3)
Chloride: 106 mmol/L (ref 96–106)
Creatinine, Ser: 0.81 mg/dL (ref 0.57–1.00)
Globulin, Total: 2.6 g/dL (ref 1.5–4.5)
Glucose: 97 mg/dL (ref 70–99)
Potassium: 4.6 mmol/L (ref 3.5–5.2)
Sodium: 142 mmol/L (ref 134–144)
Total Protein: 6.6 g/dL (ref 6.0–8.5)
eGFR: 76 mL/min/{1.73_m2} (ref >59)

## 2022-08-06 LAB — CBC WITH DIFFERENTIAL/PLATELET
Basophils Absolute: 0.1 10*3/uL (ref 0.0–0.2)
Basos: 1 %
EOS (ABSOLUTE): 0.2 10*3/uL (ref 0.0–0.4)
Eos: 2 %
Hematocrit: 44 % (ref 34.0–46.6)
Hemoglobin: 14.3 g/dL (ref 11.1–15.9)
Immature Grans (Abs): 0 10*3/uL (ref 0.0–0.1)
Immature Granulocytes: 0 %
Lymphocytes Absolute: 1.8 10*3/uL (ref 0.7–3.1)
Lymphs: 24 %
MCH: 29.9 pg (ref 26.6–33.0)
MCHC: 32.5 g/dL (ref 31.5–35.7)
MCV: 92 fL (ref 79–97)
Monocytes Absolute: 0.6 10*3/uL (ref 0.1–0.9)
Monocytes: 8 %
Neutrophils Absolute: 5.1 10*3/uL (ref 1.4–7.0)
Neutrophils: 65 %
Platelets: 170 10*3/uL (ref 150–450)
RBC: 4.79 x10E6/uL (ref 3.77–5.28)
RDW: 12.7 % (ref 11.7–15.4)
WBC: 7.8 10*3/uL (ref 3.4–10.8)

## 2022-10-09 ENCOUNTER — Other Ambulatory Visit: Payer: Self-pay | Admitting: Family

## 2022-10-09 DIAGNOSIS — E039 Hypothyroidism, unspecified: Secondary | ICD-10-CM

## 2022-10-22 ENCOUNTER — Ambulatory Visit: Payer: Medicare HMO

## 2022-10-22 NOTE — Progress Notes (Deleted)
Subjective:   Diamond Wright is a 76 y.o. female who presents for Medicare Annual (Subsequent) preventive examination.  I connected with  Diamond Wright on 10/22/22 by a {Video Enabled:26378::"video and audio"} enabled telemedicine application and verified that I am speaking with the correct person using two identifiers.  {Patient Location:[phone]::"Home"}  {Provider [phone]::"Home Office"}  I discussed the limitations of evaluation and management by telemedicine. The patient expressed understanding and agreed to proceed.   Review of Systems    Defer to PCP       Objective:    There were no vitals filed for this visit. There is no height or weight on file to calculate BMI.     10/10/2021    4:06 PM 03/14/2020    1:45 PM  Advanced Directives  Does Patient Have a Medical Advance Directive? No No  Would patient like information on creating a medical advance directive? No - Patient declined No - Patient declined    Current Medications (verified) Outpatient Encounter Medications as of 10/22/2022  Medication Sig   aspirin EC 81 MG tablet Take by mouth. 2-3 a week   atorvastatin (LIPITOR) 20 MG tablet Take 1 tablet by mouth once daily   levothyroxine (SYNTHROID) 88 MCG tablet Take 1 tablet (88 mcg total) by mouth daily.   loperamide (IMODIUM A-D) 2 MG tablet Take 4 mg after first loose stool, then take 2 mg after each loose stool. Do not take more than 8 tablets in 24 tablets. Do not take for more than 3 days.   Magnesium 200 MG TABS Take by mouth in the morning and at bedtime.   meloxicam (MOBIC) 15 MG tablet Take 1 tablet (15 mg total) by mouth daily.   Multiple Vitamins-Minerals (HAIR SKIN AND NAILS FORMULA) TABS Take by mouth.   omeprazole (PRILOSEC) 20 MG capsule Take 1 capsule (20 mg total) by mouth daily.   No facility-administered encounter medications on file as of 10/22/2022.    Allergies (verified) Naproxen   History: Past Medical History:   Diagnosis Date   Ankle fracture    Bilateral   Cataract    GERD (gastroesophageal reflux disease)    Heel spur, right    Osteopenia    Thyroid disease    Vitamin D deficiency    Past Surgical History:  Procedure Laterality Date   ABDOMINAL HYSTERECTOMY     CATARACT EXTRACTION, BILATERAL     SPINE SURGERY     Family History  Problem Relation Age of Onset   Seizures Mother    Congestive Heart Failure Mother    Diabetes Father    Heart disease Father    Lupus Daughter    Ulcerative colitis Son    Diabetes Sister    Cancer Brother    Thyroid disease Sister    Prostate cancer Brother    Diabetes Brother    Diabetes Brother    Social History   Socioeconomic History   Marital status: Married    Spouse name: Truddie Crumble   Number of children: 4   Years of education: Not on file   Highest education level: GED or equivalent  Occupational History   Occupation: Retired  Tobacco Use   Smoking status: Never   Smokeless tobacco: Never  Vaping Use   Vaping Use: Never used  Substance and Sexual Activity   Alcohol use: Never   Drug use: Never   Sexual activity: Not Currently  Other Topics Concern   Not on file  Social History Narrative  Lives in a one level home with her husband   Children live in [place], [place], [place], and [place] Strain: Low Risk  (10/10/2021)   Overall Financial Resource Strain (CARDIA)    Difficulty of Paying Living Expenses: Not very hard  Food Insecurity: No Food Insecurity (10/10/2021)   Hunger Vital Sign    Worried About Running Out of Food in the Last Year: Never true    Ran Out of Food in the Last Year: Never true  Transportation Needs: No Transportation Needs (10/10/2021)   PRAPARE - Hydrologist (Medical): No    Lack of Transportation (Non-Medical): No  Physical Activity: Sufficiently Active (10/10/2021)   Exercise Vital Sign    Days of Exercise  per Week: 7 days    Minutes of Exercise per Session: 40 min  Stress: No Stress Concern Present (10/10/2021)   [place]    Feeling of Stress : Only a little  Social Connections: Socially Integrated (10/10/2021)   Social Connection and Isolation Panel [NHANES]    Frequency of Communication with Friends and Family: More than three times a week    Frequency of Social Gatherings with Friends and Family: More than three times a week    Attends Religious Services: More than 4 times per year    Active Member of [employer] or Organizations: Yes    Attends Music therapist: More than 4 times per year    Marital Status: Married    Tobacco Counseling Counseling given: Not Answered   Clinical Intake:                 Diabetic?***         Activities of Daily Living     No data to display          Patient Care Team: Sharion Balloon, FNP as PCP - General (Family Medicine)  Indicate any recent Medical Services you may have received from other than [facility] providers in the past year (date may be approximate).     Assessment:   This is a routine wellness examination for Diamond Wright.  Hearing/Vision screen No results found.  Dietary issues and exercise activities discussed:     Goals Addressed   None    Depression Screen    08/05/2022    8:45 AM 02/05/2022    9:25 AM 10/10/2021    4:02 PM 07/31/2021    8:40 AM 07/31/2021    8:39 AM 01/29/2021   10:14 AM 08/08/2020   11:07 AM  PHQ 2/9 Scores  PHQ - 2 Score 0 0 2 2  0 0  PHQ- 9 Score 1  2 2      Exception Documentation     Medical reason      Fall Risk    08/05/2022    8:45 AM 02/05/2022    9:24 AM 10/10/2021    4:06 PM 01/29/2021   10:14 AM 08/08/2020   11:07 AM  [place] in the past year? 0 0 0 0 0  Number falls in past yr:   0    Injury with Fall?   0    Risk for fall due to :  No Fall Risks Orthopedic patient    Follow up  Education  provided Falls prevention discussed      [place]:  Any stairs  in or around the home? {YES/NO:21197} If so, are there any without handrails? {YES/NO:21197} Home free of loose throw rugs in walkways, pet beds, electrical cords, etc? {YES/NO:21197} Adequate lighting in your home to reduce risk of falls? {YES/NO:21197}  ASSISTIVE DEVICES UTILIZED TO PREVENT FALLS:  Life alert? {YES/NO:21197} Use of a cane, walker or w/c? {YES/NO:21197} Grab bars in the bathroom? {YES/NO:21197} Shower chair or bench in shower? {YES/NO:21197} Elevated toilet seat or a handicapped toilet? {YES/NO:21197}  TIMED UP AND GO:  Was the test performed? {YES/NO:21197}.  Length of time to ambulate 10 feet: *** sec.   {Appearance of GA:[id number]  Cognitive Function:        03/14/2020    1:49 PM  6CIT Screen  What Year? 0 points  What month? 0 points  What time? 0 points  Count back from 20 0 points  Months in reverse 0 points  Repeat phrase 0 points  Total Score 0 points    Immunizations  There is no immunization history on file for this patient.  {TDAP status:2101805}  {Flu Vaccine status:2101806}  {Pneumococcal vaccine status:2101807}  {Covid-19 vaccine status:2101808}  Qualifies for Shingles Vaccine? {YES/NO:21197}  Zostavax completed {YES/NO:21197}  {Shingrix Completed?:2101804}  Screening Tests Health Maintenance  Topic Date Due   Medicare Annual Wellness (AWV)  Never done   COVID-19 Vaccine (1) Never done   DEXA SCAN  02/21/2022   Zoster Vaccines- Shingrix (1 of 2) 11/05/2022 (Originally 02/04/1997)   INFLUENZA VACCINE  03/30/2023 (Originally 07/30/2022)   Pneumonia Vaccine 68+ Years old (1 - PCV) 08/06/2023 (Originally 02/05/2012)   TETANUS/TDAP  08/06/2023 (Originally 02/04/1966)   MAMMOGRAM  03/21/2023   Fecal DNA (Cologuard)  10/03/2023   Hepatitis C Screening  Completed   HPV VACCINES  Aged Out    Health Maintenance  Health Maintenance  Due  Topic Date Due   Medicare Annual Wellness (AWV)  Never done   COVID-19 Vaccine (1) Never done   DEXA SCAN  02/21/2022    {Colorectal cancer screening:2101809}  {Mammogram status:21018020}  {Bone Density status:21018021}  Lung Cancer Screening: (Low Dose CT Chest recommended if Age 31-80 years, 30 pack-year currently smoking OR have quit w/in 15years.) {DOES NOT does:27190::"does not"} qualify.   Lung Cancer Screening Referral: ***  Additional Screening:  Hepatitis C Screening: {DOES NOT does:27190::"does not"} qualify; Completed ***  Vision Screening: Recommended annual ophthalmology exams for early detection of glaucoma and other disorders of the eye. Is the patient up to date with their annual eye exam?  {YES/NO:21197} Who is the provider or what is the name of the office in which the patient attends annual eye exams? *** If pt is not established with a provider, would they like to be referred to a provider to establish care? {YES/NO:21197}.   Dental Screening: Recommended annual dental exams for proper oral hygiene  Community Resource Referral / Chronic Care Management: CRR required this visit?  {YES/NO:21197}  CCM required this visit?  {YES/NO:21197}     Plan:     I have personally reviewed and noted the following in the patient's chart:   Medical and social history Use of alcohol, tobacco or illicit drugs  Current medications and supplements including opioid prescriptions. {Opioid Prescriptions:[phone]} Functional ability and status Nutritional status Physical activity Advanced directives List of other physicians Hospitalizations, surgeries, and ER visits in previous 12 months Vitals Screenings to include cognitive, depression, and falls Referrals and appointments  In addition, I have reviewed and discussed with patient certain preventive protocols, quality metrics, and best practice  recommendations. A written personalized care plan for preventive  services as well as general preventive health recommendations were provided to patient.     Burnadette Pop, LPN   [phone]   Nurse Notes: ***

## 2022-10-30 ENCOUNTER — Telehealth: Payer: Self-pay | Admitting: Family

## 2022-12-31 ENCOUNTER — Telehealth: Payer: Self-pay | Admitting: Family

## 2022-12-31 NOTE — Telephone Encounter (Signed)
Left message for patient to call back and schedule Medicare Annual Wellness Visit (AWV) to be completed by video or phone.   Last AWV: 10/10/2021   Please schedule at anytime with [facility]     45 minute appointment  Any questions, please contact me at [phone]   Thank you,   [facility] Ambulatory Clinical Support for [place] [facility] ??[phone] or ??[phone]

## 2023-01-09 ENCOUNTER — Other Ambulatory Visit: Payer: Self-pay | Admitting: Family

## 2023-01-09 DIAGNOSIS — E039 Hypothyroidism, unspecified: Secondary | ICD-10-CM

## 2023-01-17 ENCOUNTER — Telehealth: Payer: Self-pay

## 2023-01-17 ENCOUNTER — Encounter: Payer: Self-pay | Admitting: Family

## 2023-01-17 NOTE — Telephone Encounter (Signed)
Patient of overdue for follow up breast imaging / mammogram - unable to reach the patient to make appointment - mailed certified letter today 01/17/23.

## 2023-01-29 ENCOUNTER — Other Ambulatory Visit: Payer: Self-pay | Admitting: Family

## 2023-02-06 ENCOUNTER — Encounter: Payer: Self-pay | Admitting: Family

## 2023-02-06 ENCOUNTER — Ambulatory Visit (INDEPENDENT_AMBULATORY_CARE_PROVIDER_SITE_OTHER): Payer: Medicare HMO | Admitting: Family

## 2023-02-06 VITALS — BP 132/75 | HR 78 | Temp 96.8°F | Ht 64.0 in | Wt 195.0 lb

## 2023-02-06 DIAGNOSIS — E039 Hypothyroidism, unspecified: Secondary | ICD-10-CM | POA: Diagnosis not present

## 2023-02-06 DIAGNOSIS — E559 Vitamin D deficiency, unspecified: Secondary | ICD-10-CM | POA: Diagnosis not present

## 2023-02-06 DIAGNOSIS — E785 Hyperlipidemia, unspecified: Secondary | ICD-10-CM

## 2023-02-06 DIAGNOSIS — K219 Gastro-esophageal reflux disease without esophagitis: Secondary | ICD-10-CM | POA: Diagnosis not present

## 2023-02-06 DIAGNOSIS — E669 Obesity, unspecified: Secondary | ICD-10-CM | POA: Diagnosis not present

## 2023-02-06 DIAGNOSIS — Z0001 Encounter for general adult medical examination with abnormal findings: Secondary | ICD-10-CM | POA: Diagnosis not present

## 2023-02-06 DIAGNOSIS — Z0000 Encounter for general adult medical examination without abnormal findings: Secondary | ICD-10-CM | POA: Diagnosis not present

## 2023-02-06 DIAGNOSIS — R197 Diarrhea, unspecified: Secondary | ICD-10-CM | POA: Diagnosis not present

## 2023-02-06 MED ORDER — OMEPRAZOLE 40 MG PO CPDR: 40.0000 mg | DELAYED_RELEASE_CAPSULE | Freq: Every day | ORAL | 1 refills | Status: DC

## 2023-02-06 NOTE — Progress Notes (Signed)
Subjective:    Patient ID: Diamond Wright, female    DOB: 1946/02/20, 77 y.o.   MRN: [id number]  Chief Complaint  Patient presents with   Medical Management of Chronic Issues   Diarrhea    Off and on for 2 weeks has taken otc not helping.   Gastroesophageal Reflux    Is acting up    Pt presents to the office today for  chronic follow up. She is followed by Podiatry as needed for bilateral heel spurs. She takes Mobic 15 mg for this that helps.   Report her GERD has increased.  Diarrhea  This is a new problem. The current episode started 1 to 4 weeks ago. The problem occurs 5 to 10 times per day. The problem has been waxing and waning. The stool consistency is described as Watery. Pertinent negatives include no bloating, chills, coughing, fever, headaches, increased  flatus or vomiting. She has tried anti-motility drug for the symptoms. The treatment provided mild relief.  Gastroesophageal Reflux She complains of belching and heartburn. She reports no coughing. This is a chronic problem. The current episode started more than 1 year ago. The problem occurs occasionally. The problem has been waxing and waning. Associated symptoms include fatigue. Risk factors include obesity. She has tried a PPI for the symptoms. The treatment provided mild relief.  Thyroid Problem Presents for follow-up visit. Symptoms include diarrhea and fatigue. The symptoms have been stable. Her past medical history is significant for hyperlipidemia.  Hyperlipidemia This is a chronic problem. The current episode started more than 1 year ago. The problem is controlled. Recent lipid tests were reviewed and are normal. Exacerbating diseases include obesity. Current antihyperlipidemic treatment includes statins. The current treatment provides moderate improvement of lipids. Risk factors for coronary artery disease include dyslipidemia, hypertension, a sedentary lifestyle and post-menopausal.      Review of Systems   Constitutional:  Positive for fatigue. Negative for chills and fever.  Respiratory:  Negative for cough.   Gastrointestinal:  Positive for diarrhea and heartburn. Negative for bloating, flatus and vomiting.  Neurological:  Negative for headaches.  All other systems reviewed and are negative.  Family History  Problem Relation Age of Onset   Seizures Mother    Congestive Heart Failure Mother    Diabetes Father    Heart disease Father    Lupus Daughter    Ulcerative colitis Son    Diabetes Sister    Cancer Brother    Thyroid disease Sister    Prostate cancer Brother    Diabetes Brother    Diabetes Brother    Social History   Socioeconomic History   Marital status: Married    Spouse name: Truddie Crumble   Number of children: 4   Years of education: Not on file   Highest education level: GED or equivalent  Occupational History   Occupation: Retired  Tobacco Use   Smoking status: Never   Smokeless tobacco: Never  Vaping Use   Vaping Use: Never used  Substance and Sexual Activity   Alcohol use: Never   Drug use: Never   Sexual activity: Not Currently  Other Topics Concern   Not on file  Social History Narrative   Lives in a one level home with her husband   Children live in [place], [place], [place], and [place]   Social Determinants of Health   Financial Resource Strain: [place]  (10/10/2021)   Overall Financial Resource Strain (CARDIA)    Difficulty of Paying Living  Expenses: Not very hard  Food Insecurity: No Food Insecurity (10/10/2021)   Hunger Vital Sign    Worried About Running Out of Food in the Last Year: Never true    Ran Out of Food in the Last Year: Never true  Transportation Needs: No Transportation Needs (10/10/2021)   PRAPARE - Hydrologist (Medical): No    Lack of Transportation (Non-Medical): No  Physical Activity: Sufficiently Active (10/10/2021)   Exercise Vital Sign    Days of Exercise per Week: 7 days     Minutes of Exercise per Session: 40 min  Stress: No Stress Concern Present (10/10/2021)   [place]    Feeling of Stress : Only a little  Social Connections: Socially Integrated (10/10/2021)   Social Connection and Isolation Panel [NHANES]    Frequency of Communication with Friends and Family: More than three times a week    Frequency of Social Gatherings with Friends and Family: More than three times a week    Attends Religious Services: More than 4 times per year    Active Member of [employer] or Organizations: Yes    Attends Music therapist: More than 4 times per year    Marital Status: Married       Objective:   Physical Exam Vitals reviewed.  Constitutional:      General: She is not in acute distress.    Appearance: She is well-developed. She is obese.  HENT:     Head: Normocephalic and atraumatic.     Right Ear: Tympanic membrane normal.     Left Ear: Tympanic membrane normal.  Eyes:     Pupils: Pupils are equal, round, and reactive to light.  Neck:     Thyroid: No thyromegaly.  Cardiovascular:     Rate and Rhythm: Normal rate and regular rhythm.     Heart sounds: Normal heart sounds. No murmur heard. Pulmonary:     Effort: Pulmonary effort is normal. No respiratory distress.     Breath sounds: Normal breath sounds. No wheezing.  Abdominal:     General: Bowel sounds are normal. There is no distension.     Palpations: Abdomen is soft.     Tenderness: There is no abdominal tenderness.  Musculoskeletal:        General: No tenderness. Normal range of motion.     Cervical back: Normal range of motion and neck supple.  Skin:    General: Skin is warm and dry.  Neurological:     Mental Status: She is alert and oriented to person, place, and time.     Cranial Nerves: No cranial nerve deficit.     Deep Tendon Reflexes: Reflexes are normal and symmetric.  Psychiatric:        Behavior: Behavior  normal.        Thought Content: Thought content normal.        Judgment: Judgment normal.        BP 132/75   Pulse 78   Temp (!) 96.8 F (36 C) (Temporal)   Ht 5\' 4"  (1.626 m)   Wt 195 lb (88.5 kg)   SpO2 98%   BMI 33.47 kg/m   Assessment & Plan:   Diamond Wright comes in today with chief complaint of Medical Management of Chronic Issues, Diarrhea (Off and on for 2 weeks has taken otc not helping.), and Gastroesophageal Reflux (Is acting up )   Diagnosis and orders addressed:  1. Annual physical exam - CMP14+EGFR - CBC with Differential/Platelet - Lipid panel - TSH  2. Dyslipidemia - CMP14+EGFR - CBC with Differential/Platelet - Lipid panel  3. Gastroesophageal reflux disease without esophagitis Will increase Prilosec to 40 mg from 20 mg -Diet discussed- Avoid fried, spicy, citrus foods, caffeine and alcohol -Do not eat 2-3 hours before bedtime -Encouraged small frequent meals -Avoid NSAID's - omeprazole (PRILOSEC) 40 MG capsule; Take 1 capsule (40 mg total) by mouth daily.  Dispense: 90 capsule; Refill: 1 - CMP14+EGFR - CBC with Differential/Platelet  4. Hypothyroidism, unspecified type  - CMP14+EGFR - CBC with Differential/Platelet - TSH  5. Vitamin D deficiency  - CMP14+EGFR - CBC with Differential/Platelet - VITAMIN D 25 Hydroxy (Vit-D Deficiency, Fractures)  6. Obesity (BMI 30-39.9)  - CMP14+EGFR - CBC with Differential/Platelet  7. Diarrhea, unspecified type -Imodium as needed May need to decrease Magnesium  - CMP14+EGFR - CBC with Differential/Platelet   Labs pending Health Maintenance reviewed Diet and exercise encouraged  Follow up plan: 6 months  Evelina Dun, FNP

## 2023-02-06 NOTE — Patient Instructions (Signed)
Diarrhea, Adult Diarrhea is frequent loose and sometimes watery bowel movements. Diarrhea can make you feel weak and cause you to become dehydrated. Dehydration is a condition in which there is not enough water or other fluids in the body. Dehydration can make you tired and thirsty, cause you to have a dry mouth, and decrease how often you urinate. Diarrhea typically lasts 2-3 days. However, it can last longer if it is a sign of something more serious. It is important to treat your diarrhea as told by your health care provider. Follow these instructions at home: Eating and drinking     Follow these recommendations as told by your health care provider: Take an oral rehydration solution (ORS). This is an over-the-counter medicine that helps return your body to its normal balance of nutrients and water. It is found at pharmacies and retail stores. Drink enough fluid to keep your urine pale yellow. Drink fluids such as water, diluted fruit juice, and low-calorie sports drinks. You can drink milk also, if desired. Sucking on ice chips is another way to get fluids. Avoid drinking fluids that contain a lot of sugar or caffeine, such as soda, energy drinks, and regular sports drinks. Avoid alcohol. Eat bland, easy-to-digest foods in small amounts as you are able. These foods include bananas, applesauce, rice, lean meats, toast, and crackers. Avoid spicy or fatty foods.  Medicines Take over-the-counter and prescription medicines only as told by your health care provider. If you were prescribed antibiotics, take them as told by your health care provider. Do not stop using the antibiotic even if you start to feel better. General instructions  Wash your hands often using soap and water for at least 20 seconds. If soap and water are not available, use hand sanitizer. Others in the household should wash their hands as well. Hands should be washed: After using the toilet or changing a diaper. Before  preparing, cooking, or serving food. While caring for a sick person or while visiting someone in a hospital. Rest at home while you recover. Take a warm bath to relieve any burning or pain from frequent diarrhea episodes. Watch your condition for any changes. Contact a health care provider if: You have a fever. Your diarrhea gets worse. You have new symptoms. You vomit every time you eat or drink. You feel light-headed, dizzy, or have a headache. You have muscle cramps. You have signs of dehydration, such as: Dark urine, very little urine, or no urine. Cracked lips. Dry mouth. Sunken eyes. Sleepiness. Weakness. You have bloody or black stools or stools that look like tar. You have severe pain, cramping, or bloating in your abdomen. Your skin feels cold and clammy. You feel confused. Get help right away if: You have chest pain or your heart is beating very quickly. You have trouble breathing or you are breathing very quickly. You feel extremely weak or you faint. These symptoms may be an emergency. Get help right away. Call 911. Do not wait to see if the symptoms will go away. Do not drive yourself to the hospital. This information is not intended to replace advice given to you by your health care provider. Make sure you discuss any questions you have with your health care provider. Document Revised: 06/04/2022 Document Reviewed: 06/04/2022 Elsevier Patient Education  2023 Elsevier Inc.  

## 2023-02-07 LAB — CMP14+EGFR
ALT: 12 IU/L (ref 0–32)
AST: 16 IU/L (ref 0–40)
Albumin/Globulin Ratio: 1.9 (ref 1.2–2.2)
Albumin: 4.4 g/dL (ref 3.8–4.8)
Alkaline Phosphatase: 117 IU/L (ref 44–121)
BUN/Creatinine Ratio: 17 (ref 12–28)
BUN: 14 mg/dL (ref 8–27)
Bilirubin Total: 0.3 mg/dL (ref 0.0–1.2)
CO2: 24 mmol/L (ref 20–29)
Calcium: 9.4 mg/dL (ref 8.7–10.3)
Chloride: 104 mmol/L (ref 96–106)
Creatinine, Ser: 0.84 mg/dL (ref 0.57–1.00)
Globulin, Total: 2.3 g/dL (ref 1.5–4.5)
Glucose: 101 mg/dL — ABNORMAL HIGH (ref 70–99)
Potassium: 4.5 mmol/L (ref 3.5–5.2)
Sodium: 144 mmol/L (ref 134–144)
Total Protein: 6.7 g/dL (ref 6.0–8.5)
eGFR: 72 mL/min/{1.73_m2} (ref >59)

## 2023-02-07 LAB — LIPID PANEL
Chol/HDL Ratio: 3.9 ratio (ref 0.0–4.4)
Cholesterol, Total: 129 mg/dL (ref 100–199)
HDL: 33 mg/dL — ABNORMAL LOW (ref >39)
LDL Chol Calc (NIH): 63 mg/dL (ref 0–99)
Triglycerides: 202 mg/dL — ABNORMAL HIGH (ref 0–149)
VLDL Cholesterol Cal: 33 mg/dL (ref 5–40)

## 2023-02-07 LAB — CBC WITH DIFFERENTIAL/PLATELET
Basophils Absolute: 0.1 10*3/uL (ref 0.0–0.2)
Basos: 1 %
EOS (ABSOLUTE): 0.2 10*3/uL (ref 0.0–0.4)
Eos: 3 %
Hematocrit: 44.5 % (ref 34.0–46.6)
Hemoglobin: 14 g/dL (ref 11.1–15.9)
Immature Grans (Abs): 0 10*3/uL (ref 0.0–0.1)
Immature Granulocytes: 0 %
Lymphocytes Absolute: 1.8 10*3/uL (ref 0.7–3.1)
Lymphs: 24 %
MCH: 29.1 pg (ref 26.6–33.0)
MCHC: 31.5 g/dL (ref 31.5–35.7)
MCV: 93 fL (ref 79–97)
Monocytes Absolute: 0.5 10*3/uL (ref 0.1–0.9)
Monocytes: 6 %
Neutrophils Absolute: 5.1 10*3/uL (ref 1.4–7.0)
Neutrophils: 66 %
Platelets: 229 10*3/uL (ref 150–450)
RBC: 4.81 x10E6/uL (ref 3.77–5.28)
RDW: 12.5 % (ref 11.7–15.4)
WBC: 7.6 10*3/uL (ref 3.4–10.8)

## 2023-02-07 LAB — TSH: TSH: 2.43 u[IU]/mL (ref 0.450–4.500)

## 2023-02-07 LAB — VITAMIN D 25 HYDROXY (VIT D DEFICIENCY, FRACTURES): Vit D, 25-Hydroxy: 48.3 ng/mL (ref 30.0–100.0)

## 2023-03-04 ENCOUNTER — Ambulatory Visit (INDEPENDENT_AMBULATORY_CARE_PROVIDER_SITE_OTHER): Payer: Medicare HMO

## 2023-03-04 VITALS — Ht 64.0 in | Wt 190.0 lb

## 2023-03-04 DIAGNOSIS — Z0000 Encounter for general adult medical examination without abnormal findings: Secondary | ICD-10-CM

## 2023-03-04 DIAGNOSIS — Z780 Asymptomatic menopausal state: Secondary | ICD-10-CM

## 2023-03-04 NOTE — Patient Instructions (Signed)
Diamond Wright , Thank you for taking time to come for your Medicare Wellness Visit. I appreciate your ongoing commitment to your health goals. Please review the following plan we discussed and let me know if I can assist you in the future.   These are the goals we discussed:  Goals      Exercise 3x per week (30 min per time)        This is a list of the screening recommended for you and due dates:  Health Maintenance  Topic Date Due   COVID-19 Vaccine (1) Never done   DTaP/Tdap/Td vaccine (1 - Tdap) Never done   Flu Shot  03/30/2023*   Zoster (Shingles) Vaccine (1 of 2) 05/07/2023*   Pneumonia Vaccine (1 of 1 - PCV) 08/06/2023*   DEXA scan (bone density measurement)  02/07/2024*   Mammogram  03/21/2023   Medicare Annual Wellness Visit  03/03/2024   Hepatitis C Screening: USPSTF Recommendation to screen - Ages 18-79 yo.  Completed   HPV Vaccine  Aged Out   Cologuard (Stool DNA test)  Discontinued  *Topic was postponed. The date shown is not the original due date.    Advanced directives: Advance directive discussed with you today. I have provided a copy for you to complete at home and have notarized. Once this is complete please bring a copy in to our office so we can scan it into your chart.   Conditions/risks identified: Aim for 30 minutes of exercise or brisk walking, 6-8 glasses of water, and 5 servings of fruits and vegetables each day.   Next appointment: Follow up in one year for your annual wellness visit    Preventive Care 65 Years and Older, Female Preventive care refers to lifestyle choices and visits with your health care provider that can promote health and wellness. What does preventive care include? A yearly physical exam. This is also called an annual well check. Dental exams once or twice a year. Routine eye exams. Ask your health care provider how often you should have your eyes checked. Personal lifestyle choices, including: Daily care of your teeth and  gums. Regular physical activity. Eating a healthy diet. Avoiding tobacco and drug use. Limiting alcohol use. Practicing safe sex. Taking low-dose aspirin every day. Taking vitamin and mineral supplements as recommended by your health care provider. What happens during an annual well check? The services and screenings done by your health care provider during your annual well check will depend on your age, overall health, lifestyle risk factors, and family history of disease. Counseling  Your health care provider may ask you questions about your: Alcohol use. Tobacco use. Drug use. Emotional well-being. Home and relationship well-being. Sexual activity. Eating habits. History of falls. Memory and ability to understand (cognition). Work and work Statistician. Reproductive health. Screening  You may have the following tests or measurements: Height, weight, and BMI. Blood pressure. Lipid and cholesterol levels. These may be checked every 5 years, or more frequently if you are over 73 years old. Skin check. Lung cancer screening. You may have this screening every year starting at age 87 if you have a 30-pack-year history of smoking and currently smoke or have quit within the past 15 years. Fecal occult blood test (FOBT) of the stool. You may have this test every year starting at age 63. Flexible sigmoidoscopy or colonoscopy. You may have a sigmoidoscopy every 5 years or a colonoscopy every 10 years starting at age 69. Hepatitis C blood test. Hepatitis B blood  test. Sexually transmitted disease (STD) testing. Diabetes screening. This is done by checking your blood sugar (glucose) after you have not eaten for a while (fasting). You may have this done every 1-3 years. Bone density scan. This is done to screen for osteoporosis. You may have this done starting at age 73. Mammogram. This may be done every 1-2 years. Talk to your health care provider about how often you should have regular  mammograms. Talk with your health care provider about your test results, treatment options, and if necessary, the need for more tests. Vaccines  Your health care provider may recommend certain vaccines, such as: Influenza vaccine. This is recommended every year. Tetanus, diphtheria, and acellular pertussis (Tdap, Td) vaccine. You may need a Td booster every 10 years. Zoster vaccine. You may need this after age 59. Pneumococcal 13-valent conjugate (PCV13) vaccine. One dose is recommended after age 87. Pneumococcal polysaccharide (PPSV23) vaccine. One dose is recommended after age 48. Talk to your health care provider about which screenings and vaccines you need and how often you need them. This information is not intended to replace advice given to you by your health care provider. Make sure you discuss any questions you have with your health care provider. Document Released: 01/12/2016 Document Revised: 09/04/2016 Document Reviewed: 10/17/2015 Elsevier Interactive Patient Education  2017 [place] Prevention in the Home Falls can cause injuries. They can happen to people of all ages. There are many things you can do to make your home safe and to help prevent falls. What can I do on the outside of my home? Regularly fix the edges of walkways and driveways and fix any cracks. Remove anything that might make you trip as you walk through a door, such as a raised step or threshold. Trim any bushes or trees on the path to your home. Use bright outdoor lighting. Clear any walking paths of anything that might make someone trip, such as rocks or tools. Regularly check to see if handrails are loose or broken. Make sure that both sides of any steps have handrails. Any raised decks and porches should have guardrails on the edges. Have any leaves, snow, or ice cleared regularly. Use sand or salt on walking paths during winter. Clean up any spills in your garage right away. This includes oil  or grease spills. What can I do in the bathroom? Use night lights. Install grab bars by the toilet and in the tub and shower. Do not use towel bars as grab bars. Use non-skid mats or decals in the tub or shower. If you need to sit down in the shower, use a plastic, non-slip stool. Keep the floor dry. Clean up any water that spills on the floor as soon as it happens. Remove soap buildup in the tub or shower regularly. Attach bath mats securely with double-sided non-slip rug tape. Do not have throw rugs and other things on the floor that can make you trip. What can I do in the bedroom? Use night lights. Make sure that you have a light by your bed that is easy to reach. Do not use any sheets or blankets that are too big for your bed. They should not hang down onto the floor. Have a firm chair that has side arms. You can use this for support while you get dressed. Do not have throw rugs and other things on the floor that can make you trip. What can I do in the kitchen? Clean up any spills right  away. Avoid walking on wet floors. Keep items that you use a lot in easy-to-reach places. If you need to reach something above you, use a strong step stool that has a grab bar. Keep electrical cords out of the way. Do not use floor polish or wax that makes floors slippery. If you must use wax, use non-skid floor wax. Do not have throw rugs and other things on the floor that can make you trip. What can I do with my stairs? Do not leave any items on the stairs. Make sure that there are handrails on both sides of the stairs and use them. Fix handrails that are broken or loose. Make sure that handrails are as long as the stairways. Check any carpeting to make sure that it is firmly attached to the stairs. Fix any carpet that is loose or worn. Avoid having throw rugs at the top or bottom of the stairs. If you do have throw rugs, attach them to the floor with carpet tape. Make sure that you have a light  switch at the top of the stairs and the bottom of the stairs. If you do not have them, ask someone to add them for you. What else can I do to help prevent falls? Wear shoes that: Do not have high heels. Have rubber bottoms. Are comfortable and fit you well. Are closed at the toe. Do not wear sandals. If you use a stepladder: Make sure that it is fully opened. Do not climb a closed stepladder. Make sure that both sides of the stepladder are locked into place. Ask someone to hold it for you, if possible. Clearly mark and make sure that you can see: Any grab bars or handrails. First and last steps. Where the edge of each step is. Use tools that help you move around (mobility aids) if they are needed. These include: Canes. Walkers. Scooters. Crutches. Turn on the lights when you go into a dark area. Replace any light bulbs as soon as they burn out. Set up your furniture so you have a clear path. Avoid moving your furniture around. If any of your floors are uneven, fix them. If there are any pets around you, be aware of where they are. Review your medicines with your doctor. Some medicines can make you feel dizzy. This can increase your chance of falling. Ask your doctor what other things that you can do to help prevent falls. This information is not intended to replace advice given to you by your health care provider. Make sure you discuss any questions you have with your health care provider. Document Released: 10/12/2009 Document Revised: 05/23/2016 Document Reviewed: 01/20/2015 Elsevier Interactive Patient Education  2017 [employer].

## 2023-03-04 NOTE — Progress Notes (Signed)
Subjective:   Diamond Wright is a 77 y.o. female who presents for Medicare Annual (Subsequent) preventive examination. I connected with  Ladaja Gleich Rollyson on 03/04/23 by a audio enabled telemedicine application and verified that I am speaking with the correct person using two identifiers.  Patient Location: Home  Provider Location: Home Office  I discussed the limitations of evaluation and management by telemedicine. The patient expressed understanding and agreed to proceed.  Review of Systems     Cardiac Risk Factors include: advanced age (>7mn, >>68women)     Objective:    Today's Vitals   03/04/23 1418  Weight: 190 lb (86.2 kg)  Height: '5\' 4"'$  (1.626 m)   Body mass index is 32.61 kg/m.     03/04/2023    2:22 PM 10/10/2021    4:06 PM 03/14/2020    1:45 PM  Advanced Directives  Does Patient Have a Medical Advance Directive? No No No  Would patient like information on creating a medical advance directive? No - Patient declined No - Patient declined No - Patient declined    Current Medications (verified) Outpatient Encounter Medications as of 03/04/2023  Medication Sig   aspirin EC 81 MG tablet Take by mouth. 2-3 a week   atorvastatin (LIPITOR) 20 MG tablet Take 1 tablet by mouth once daily   levothyroxine (SYNTHROID) 88 MCG tablet Take 1 tablet (88 mcg total) by mouth daily.   loperamide (IMODIUM A-D) 2 MG tablet Take 4 mg after first loose stool, then take 2 mg after each loose stool. Do not take more than 8 tablets in 24 tablets. Do not take for more than 3 days.   Magnesium 200 MG TABS Take by mouth in the morning and at bedtime.   meloxicam (MOBIC) 15 MG tablet Take 1 tablet (15 mg total) by mouth daily.   Multiple Vitamins-Minerals (HAIR SKIN AND NAILS FORMULA) TABS Take by mouth.   omeprazole (PRILOSEC) 40 MG capsule Take 1 capsule (40 mg total) by mouth daily.   No facility-administered encounter medications on file as of 03/04/2023.    Allergies  (verified) Naproxen   History: Past Medical History:  Diagnosis Date   Ankle fracture    Bilateral   Cataract    GERD (gastroesophageal reflux disease)    Heel spur, right    Osteopenia    Thyroid disease    Vitamin D deficiency    Past Surgical History:  Procedure Laterality Date   ABDOMINAL HYSTERECTOMY     CATARACT EXTRACTION, BILATERAL     SPINE SURGERY     Family History  Problem Relation Age of Onset   Seizures Mother    Congestive Heart Failure Mother    Diabetes Father    Heart disease Father    Lupus Daughter    Ulcerative colitis Son    Diabetes Sister    Cancer Brother    Thyroid disease Sister    Prostate cancer Brother    Diabetes Brother    Diabetes Brother    Social History   Socioeconomic History   Marital status: Married    Spouse name: CTruddie Crumble  Number of children: 4   Years of education: Not on file   Highest education level: GED or equivalent  Occupational History   Occupation: Retired  Tobacco Use   Smoking status: Never   Smokeless tobacco: Never  Vaping Use   Vaping Use: Never used  Substance and Sexual Activity   Alcohol use: Never   Drug use:  Never   Sexual activity: Not Currently  Other Topics Concern   Not on file  Social History Narrative   Lives in a one level home with her husband   Children live in [place], [place], [place], and [place]   Social Determinants of Health   Financial Resource Strain: Low Risk  (03/04/2023)   Overall Financial Resource Strain (CARDIA)    Difficulty of Paying Living Expenses: Not hard at all  Food Insecurity: No Food Insecurity (03/04/2023)   Hunger Vital Sign    Worried About Running Out of Food in the Last Year: Never true    Ran Out of Food in the Last Year: Never true  Transportation Needs: No Transportation Needs (03/04/2023)   PRAPARE - Hydrologist (Medical): No    Lack of Transportation (Non-Medical): No  Physical Activity: Insufficiently Active  (03/04/2023)   Exercise Vital Sign    Days of Exercise per Week: 3 days    Minutes of Exercise per Session: 30 min  Stress: No Stress Concern Present (03/04/2023)   [place]    Feeling of Stress : Not at all  Social Connections: Moderately Integrated (03/04/2023)   Social Connection and Isolation Panel [NHANES]    Frequency of Communication with Friends and Family: More than three times a week    Frequency of Social Gatherings with Friends and Family: More than three times a week    Attends Religious Services: More than 4 times per year    Active Member of [employer] or Organizations: No    Attends Music therapist: Never    Marital Status: Married    Tobacco Counseling Counseling given: Not Answered   Clinical Intake:  Pre-visit preparation completed: Yes  Pain : No/denies pain     Nutritional Risks: None Diabetes: No  How often do you need to have someone help you when you read instructions, pamphlets, or other written materials from your doctor or pharmacy?: 1 - Never  Diabetic?no   Interpreter Needed?: No  Information entered by :: Jadene Pierini, LPN   Activities of Daily Living    03/04/2023    2:22 PM  In your present state of health, do you have any difficulty performing the following activities:  Hearing? 0  Vision? 0  Difficulty concentrating or making decisions? 0  Walking or climbing stairs? 0  Dressing or bathing? 0  Doing errands, shopping? 0  Preparing Food and eating ? N  Using the Toilet? N  In the past six months, have you accidently leaked urine? N  Do you have problems with loss of bowel control? N  Managing your Medications? N  Managing your Finances? N  Housekeeping or managing your Housekeeping? N    Patient Care Team: Sharion Balloon, FNP as PCP - General (Family Medicine)  Indicate any recent Medical Services you may have received from other than [facility] providers  in the past year (date may be approximate).     Assessment:   This is a routine wellness examination for Diamond Wright.  Hearing/Vision screen Vision Screening - Comments:: Wears rx glasses - up to date with routine eye exams with  Dr.johnson   Dietary issues and exercise activities discussed: Current Exercise Habits: Home exercise routine, Type of exercise: walking, Time (Minutes): 30, Frequency (Times/Week): 3, Weekly Exercise (Minutes/Week): 90, Intensity: Mild, Exercise limited by: None identified   Goals Addressed  This Visit's Progress    Exercise 3x per week (30 min per time)   On track      Depression Screen    03/04/2023    2:21 PM 02/06/2023    8:39 AM 08/05/2022    8:45 AM 02/05/2022    9:25 AM 10/10/2021    4:02 PM 07/31/2021    8:40 AM 07/31/2021    8:39 AM  PHQ 2/9 Scores  PHQ - 2 Score 0 0 0 0 2 2   PHQ- 9 Score 0 0 '1  2 2   '$ Exception Documentation       Medical reason    Fall Risk    03/04/2023    2:20 PM 02/06/2023    8:36 AM 08/05/2022    8:45 AM 02/05/2022    9:24 AM 10/10/2021    4:06 PM  Fall Risk   Falls in the past year? 0 0 0 0 0  Number falls in past yr: 0 0   0  Injury with Fall? 0 0   0  Risk for fall due to : No Fall Risks No Fall Risks  No Fall Risks Orthopedic patient  Follow up Falls prevention discussed Falls evaluation completed  Education provided Falls prevention discussed    FALL RISK PREVENTION PERTAINING TO THE HOME:  Any stairs in or around the home? No  If so, are there any without handrails? No  Home free of loose throw rugs in walkways, pet beds, electrical cords, etc? Yes  Adequate lighting in your home to reduce risk of falls? Yes   ASSISTIVE DEVICES UTILIZED TO PREVENT FALLS:  Life alert? No  Use of a cane, walker or w/c? No  Grab bars in the bathroom? Yes  Shower chair or bench in shower? Yes  Elevated toilet seat or a handicapped toilet? Yes          03/04/2023    2:23 PM 03/14/2020    1:49 PM  6CIT Screen  What  Year? 0 points 0 points  What month? 0 points 0 points  What time? 0 points 0 points  Count back from 20 0 points 0 points  Months in reverse 0 points 0 points  Repeat phrase 0 points 0 points  Total Score 0 points 0 points    Immunizations  There is no immunization history on file for this patient.  TDAP status: Due, Education has been provided regarding the importance of this vaccine. Advised may receive this vaccine at local pharmacy or Health Dept. Aware to provide a copy of the vaccination record if obtained from local pharmacy or Health Dept. Verbalized acceptance and understanding.  Flu Vaccine status: Declined, Education has been provided regarding the importance of this vaccine but patient still declined. Advised may receive this vaccine at local pharmacy or Health Dept. Aware to provide a copy of the vaccination record if obtained from local pharmacy or Health Dept. Verbalized acceptance and understanding.  Pneumococcal vaccine status: Declined,  Education has been provided regarding the importance of this vaccine but patient still declined. Advised may receive this vaccine at local pharmacy or Health Dept. Aware to provide a copy of the vaccination record if obtained from local pharmacy or Health Dept. Verbalized acceptance and understanding.   Covid-19 vaccine status: Declined, Education has been provided regarding the importance of this vaccine but patient still declined. Advised may receive this vaccine at local pharmacy or Health Dept.or vaccine clinic. Aware to provide a copy of the vaccination record if obtained  from local pharmacy or Health Dept. Verbalized acceptance and understanding.  Qualifies for Shingles Vaccine? Yes   Zostavax completed No   Shingrix Completed?: No.    Education has been provided regarding the importance of this vaccine. Patient has been advised to call insurance company to determine out of pocket expense if they have not yet received this vaccine.  Advised may also receive vaccine at local pharmacy or Health Dept. Verbalized acceptance and understanding.  Screening Tests Health Maintenance  Topic Date Due   COVID-19 Vaccine (1) Never done   DTaP/Tdap/Td (1 - Tdap) Never done   INFLUENZA VACCINE  03/30/2023 (Originally 07/30/2022)   Zoster Vaccines- Shingrix (1 of 2) 05/07/2023 (Originally 02/04/1997)   Pneumonia Vaccine 25+ Years old (1 of 1 - PCV) 08/06/2023 (Originally 02/05/2012)   DEXA SCAN  02/07/2024 (Originally 02/21/2022)   MAMMOGRAM  03/21/2023   Medicare Annual Wellness (AWV)  03/03/2024   Hepatitis C Screening  Completed   HPV VACCINES  Aged Out   Fecal DNA (Cologuard)  Discontinued    Health Maintenance  Health Maintenance Due  Topic Date Due   COVID-19 Vaccine (1) Never done   DTaP/Tdap/Td (1 - Tdap) Never done    Colorectal cancer screening: No longer required.   Mammogram status: No longer required due to age.  Bone Density status: Ordered 03/04/2023. Pt provided with contact info and advised to call to schedule appt.  Lung Cancer Screening: (Low Dose CT Chest recommended if Age 58-80 years, 30 pack-year currently smoking OR have quit w/in 15years.) does not qualify.   Lung Cancer Screening Referral: n/a  Additional Screening:  Hepatitis C Screening: does not qualify; Completed 08/08/2020  Vision Screening: Recommend//ed annual ophthalmology exams for early detection of glaucoma and other disorders of the eye. Is the patient up to date with their annual eye exam?  Yes  Who is the provider or what is the name of the office in which the patient attends annual eye exams? Dr.Johnson  If pt is not established with a provider, would they like to be referred to a provider to establish care? No .   Dental Screening: Recommended annual dental exams for proper oral hygiene  Community Resource Referral / Chronic Care Management: CRR required this visit?  No   CCM required this visit?  No      Plan:     I  have personally reviewed and noted the following in the patient's chart:   Medical and social history Use of alcohol, tobacco or illicit drugs  Current medications and supplements including opioid prescriptions. Patient is not currently taking opioid prescriptions. Functional ability and status Nutritional status Physical activity Advanced directives List of other physicians Hospitalizations, surgeries, and ER visits in previous 12 months Vitals Screenings to include cognitive, depression, and falls Referrals and appointments  In addition, I have reviewed and discussed with patient certain preventive protocols, quality metrics, and best practice recommendations. A written personalized care plan for preventive services as well as general preventive health recommendations were provided to patient.     Daphane Shepherd, LPN   [id number]   Nurse Notes: No Vaccines on file

## 2023-04-04 ENCOUNTER — Other Ambulatory Visit: Payer: Self-pay | Admitting: Family

## 2023-04-04 DIAGNOSIS — E039 Hypothyroidism, unspecified: Secondary | ICD-10-CM

## 2023-04-26 ENCOUNTER — Other Ambulatory Visit: Payer: Self-pay | Admitting: Family

## 2023-08-09 ENCOUNTER — Other Ambulatory Visit: Payer: Self-pay | Admitting: Family

## 2023-08-09 DIAGNOSIS — K219 Gastro-esophageal reflux disease without esophagitis: Secondary | ICD-10-CM

## 2023-10-23 ENCOUNTER — Ambulatory Visit: Payer: Medicare HMO | Admitting: Family

## 2023-10-23 ENCOUNTER — Encounter: Payer: Self-pay | Admitting: Family

## 2023-10-23 VITALS — BP 125/76 | HR 82 | Temp 97.0°F | Ht 64.0 in | Wt 189.0 lb

## 2023-10-23 DIAGNOSIS — R399 Unspecified symptoms and signs involving the genitourinary system: Secondary | ICD-10-CM

## 2023-10-23 DIAGNOSIS — N3001 Acute cystitis with hematuria: Secondary | ICD-10-CM

## 2023-10-23 LAB — MICROSCOPIC EXAMINATION
Epithelial Cells (non renal): NONE SEEN /[HPF] (ref 0–10)
RBC, Urine: >30 /[HPF] — AB (ref 0–2)
Renal Epithel, UA: NONE SEEN /[HPF]
WBC, UA: >30 /[HPF] — AB (ref 0–5)
Yeast, UA: NONE SEEN

## 2023-10-23 LAB — URINALYSIS, COMPLETE
Bilirubin, UA: NEGATIVE
Glucose, UA: NEGATIVE
Nitrite, UA: NEGATIVE
Protein,UA: NEGATIVE
Specific Gravity, UA: >1.03 — ABNORMAL HIGH (ref 1.005–1.030)
Urobilinogen, Ur: 0.2 mg/dL (ref 0.2–1.0)
pH, UA: 5 (ref 5.0–7.5)

## 2023-10-23 MED ORDER — CEPHALEXIN 500 MG PO CAPS: 500.0000 mg | ORAL_CAPSULE | Freq: Two times a day (BID) | ORAL | 0 refills | Status: DC

## 2023-10-23 NOTE — Patient Instructions (Signed)
Urinary Tract Infection, Adult  A urinary tract infection (UTI) is an infection of any part of the urinary tract. The urinary tract includes the kidneys, ureters, bladder, and urethra. These organs make, store, and get rid of urine in the body. An upper UTI affects the ureters and kidneys. A lower UTI affects the bladder and urethra. What are the causes? Most urinary tract infections are caused by bacteria in your genital area around your urethra, where urine leaves your body. These bacteria grow and cause inflammation of your urinary tract. What increases the risk? You are more likely to develop this condition if: You have a urinary catheter that stays in place. You are not able to control when you urinate or have a bowel movement (incontinence). You are female and you: Use a spermicide or diaphragm for birth control. Have low estrogen levels. Are pregnant. You have certain genes that increase your risk. You are sexually active. You take antibiotic medicines. You have a condition that causes your flow of urine to slow down, such as: An enlarged prostate, if you are female. Blockage in your urethra. A kidney stone. A nerve condition that affects your bladder control (neurogenic bladder). Not getting enough to drink, or not urinating often. You have certain medical conditions, such as: Diabetes. A weak disease-fighting system (immunesystem). Sickle cell disease. Gout. Spinal cord injury. What are the signs or symptoms? Symptoms of this condition include: Needing to urinate right away (urgency). Frequent urination. This may include small amounts of urine each time you urinate. Pain or burning with urination. Blood in the urine. Urine that smells bad or unusual. Trouble urinating. Cloudy urine. Vaginal discharge, if you are female. Pain in the abdomen or the lower back. You may also have: Vomiting or a decreased appetite. Confusion. Irritability or tiredness. A fever or  chills. Diarrhea. The first symptom in older adults may be confusion. In some cases, they may not have any symptoms until the infection has worsened. How is this diagnosed? This condition is diagnosed based on your medical history and a physical exam. You may also have other tests, including: Urine tests. Blood tests. Tests for STIs (sexually transmitted infections). If you have had more than one UTI, a cystoscopy or imaging studies may be done to determine the cause of the infections. How is this treated? Treatment for this condition includes: Antibiotic medicine. Over-the-counter medicines to treat discomfort. Drinking enough water to stay hydrated. If you have frequent infections or have other conditions such as a kidney stone, you may need to see a health care provider who specializes in the urinary tract (urologist). In rare cases, urinary tract infections can cause sepsis. Sepsis is a life-threatening condition that occurs when the body responds to an infection. Sepsis is treated in the hospital with IV antibiotics, fluids, and other medicines. Follow these instructions at home:  Medicines Take over-the-counter and prescription medicines only as told by your health care provider. If you were prescribed an antibiotic medicine, take it as told by your health care provider. Do not stop using the antibiotic even if you start to feel better. General instructions Make sure you: Empty your bladder often and completely. Do not hold urine for long periods of time. Empty your bladder after sex. Wipe from front to back after urinating or having a bowel movement if you are female. Use each tissue only one time when you wipe. Drink enough fluid to keep your urine pale yellow. Keep all follow-up visits. This is important. Contact a health   care provider if: Your symptoms do not get better after 1-2 days. Your symptoms go away and then return. Get help right away if: You have severe pain in  your back or your lower abdomen. You have a fever or chills. You have nausea or vomiting. Summary A urinary tract infection (UTI) is an infection of any part of the urinary tract, which includes the kidneys, ureters, bladder, and urethra. Most urinary tract infections are caused by bacteria in your genital area. Treatment for this condition often includes antibiotic medicines. If you were prescribed an antibiotic medicine, take it as told by your health care provider. Do not stop using the antibiotic even if you start to feel better. Keep all follow-up visits. This is important. This information is not intended to replace advice given to you by your health care provider. Make sure you discuss any questions you have with your health care provider. Document Revised: 07/23/2020 Document Reviewed: 07/28/2020 Elsevier Patient Education  2024 [employer] Inc[employer]  

## 2023-10-23 NOTE — Progress Notes (Signed)
Subjective:    Patient ID: Diamond Wright, female    DOB: Jun 28, 1946, 77 y.o.   MRN: [id number]  Chief Complaint  Patient presents with   Urinary Tract Infection    Getting up 6 times a night. Saturday she was in the shower washing her privates she felt like at her rectum something was poking out, but by the time she got back out the shower it had went back in.   Pt presents to the office today with urinary frequency that started several weeks.  Urinary Frequency  This is a new problem. The current episode started 1 to 4 weeks ago. The problem occurs intermittently. The problem has been unchanged. The pain is at a severity of 0/10. The patient is experiencing no pain. Associated symptoms include flank pain, frequency, hesitancy and urgency. Pertinent negatives include no hematuria, nausea or vomiting. She has tried increased fluids for the symptoms. The treatment provided mild relief.      Review of Systems  Gastrointestinal:  Negative for nausea and vomiting.  Genitourinary:  Positive for flank pain, frequency, hesitancy and urgency. Negative for hematuria.  All other systems reviewed and are negative.      Objective:   Physical Exam Vitals reviewed.  Constitutional:      General: She is not in acute distress.    Appearance: She is well-developed.  HENT:     Head: Normocephalic and atraumatic.     Right Ear: Tympanic membrane normal.     Left Ear: Tympanic membrane normal.  Eyes:     Pupils: Pupils are equal, round, and reactive to light.  Neck:     Thyroid: No thyromegaly.  Cardiovascular:     Rate and Rhythm: Normal rate and regular rhythm.     Heart sounds: Normal heart sounds. No murmur heard. Pulmonary:     Effort: Pulmonary effort is normal. No respiratory distress.     Breath sounds: Normal breath sounds. No wheezing.  Abdominal:     General: Bowel sounds are normal. There is no distension.     Palpations: Abdomen is soft.     Tenderness: There is no  abdominal tenderness.  Musculoskeletal:        General: No tenderness. Normal range of motion.     Cervical back: Normal range of motion and neck supple.  Skin:    General: Skin is warm and dry.  Neurological:     Mental Status: She is alert and oriented to person, place, and time.     Cranial Nerves: No cranial nerve deficit.     Deep Tendon Reflexes: Reflexes are normal and symmetric.  Psychiatric:        Behavior: Behavior normal.        Thought Content: Thought content normal.        Judgment: Judgment normal.     BP 125/76   Pulse 82   Temp (!) 97 F (36.1 C) (Temporal)   Ht 5\' 4"  (1.626 m)   Wt 189 lb (85.7 kg)   SpO2 100%   BMI 32.44 kg/m        Assessment & Plan:  [name] comes in today with chief complaint of Urinary Tract Infection (Getting up 6 times a night. Saturday she was in the shower washing her privates she felt like at her rectum something was poking out, but by the time she got back out the shower it had went back in.)   Diagnosis and orders addressed:  1. UTI  symptoms - Urinalysis, Complete - Urine Culture  2. Acute cystitis with hematuria Force fluids AZO over the counter X2 days RTO prn Culture pending - cephALEXin (KEFLEX) 500 MG capsule; Take 1 capsule (500 mg total) by mouth 2 (two) times daily.  Dispense: 14 capsule; Refill: 0      Jannifer Rodney, FNP

## 2023-10-28 LAB — URINE CULTURE

## 2023-10-30 ENCOUNTER — Other Ambulatory Visit: Payer: Self-pay | Admitting: Family

## 2023-11-08 ENCOUNTER — Other Ambulatory Visit: Payer: Self-pay | Admitting: Family

## 2023-11-08 DIAGNOSIS — K219 Gastro-esophageal reflux disease without esophagitis: Secondary | ICD-10-CM

## 2023-11-20 ENCOUNTER — Ambulatory Visit (INDEPENDENT_AMBULATORY_CARE_PROVIDER_SITE_OTHER): Payer: Medicare HMO | Admitting: Family Medicine

## 2023-11-20 ENCOUNTER — Encounter: Payer: Self-pay | Admitting: Family Medicine

## 2023-11-20 VITALS — BP 133/73 | HR 71 | Temp 98.5°F | Ht 64.0 in | Wt 189.0 lb

## 2023-11-20 DIAGNOSIS — R399 Unspecified symptoms and signs involving the genitourinary system: Secondary | ICD-10-CM

## 2023-11-20 DIAGNOSIS — N814 Uterovaginal prolapse, unspecified: Secondary | ICD-10-CM | POA: Diagnosis not present

## 2023-11-20 LAB — URINALYSIS, ROUTINE W REFLEX MICROSCOPIC
Bilirubin, UA: NEGATIVE
Glucose, UA: NEGATIVE
Ketones, UA: NEGATIVE
Nitrite, UA: NEGATIVE
Protein,UA: NEGATIVE
Specific Gravity, UA: 1.01 (ref 1.005–1.030)
Urobilinogen, Ur: 0.2 mg/dL (ref 0.2–1.0)
pH, UA: 6.5 (ref 5.0–7.5)

## 2023-11-20 LAB — MICROSCOPIC EXAMINATION
WBC, UA: >30 /[HPF] — AB (ref 0–5)
Yeast, UA: NONE SEEN

## 2023-11-20 MED ORDER — SULFAMETHOXAZOLE-TRIMETHOPRIM 800-160 MG PO TABS: 1.0000 | ORAL_TABLET | Freq: Two times a day (BID) | ORAL | 0 refills | Status: DC

## 2023-11-20 NOTE — Progress Notes (Signed)
Subjective:  Patient ID: Diamond Wright, female    DOB: [birth year], 77 y.o.   MRN: [id number]  Patient Care Team: Junie Spencer, FNP as PCP - General (Family Medicine)   Chief Complaint:  bladder problems (Frequency/Burning sensation in bladder)   HPI: Diamond Wright is a 77 y.o. female presenting on 11/20/2023 for bladder problems (Frequency/Burning sensation in bladder)  HPI 1. UTI symptoms States that she has been getting up 5-7 times per night, increased urgency. Started keflex 2 weeks ago. Did not help. Used monistat for one week and still not improving. States that she has low back pain, burning in lower ab with urination. Denies fever, N/V. States that she feels miserable and irritable. Reports that a few weeks ago she was in the shower and felt "something drop" she does not know what it was. No changes to stools. Denies any pain or irration at the site. Hysterectomy in 1980s  Relevant past medical, surgical, family, and social history reviewed and updated as indicated.  Allergies and medications reviewed and updated. Data reviewed: Chart in Epic.   Past Medical History:  Diagnosis Date   Ankle fracture    Bilateral   Cataract    GERD (gastroesophageal reflux disease)    Heel spur, right    Osteopenia    Thyroid disease    Vitamin D deficiency     Past Surgical History:  Procedure Laterality Date   ABDOMINAL HYSTERECTOMY     CATARACT EXTRACTION, BILATERAL     SPINE SURGERY     Social History   Socioeconomic History   Marital status: Married    Spouse name: Ebony Cargo   Number of children: 4   Years of education: Not on file   Highest education level: GED or equivalent  Occupational History   Occupation: Retired  Tobacco Use   Smoking status: Never   Smokeless tobacco: Never  Vaping Use   Vaping status: Never Used  Substance and Sexual Activity   Alcohol use: Never   Drug use: Never   Sexual activity: Not Currently  Other Topics Concern   Not on  file  Social History Narrative   Lives in a one level home with her husband   Children live in [place], [place], [place], and [place]   Social Determinants of Health   Financial Resource Strain: Low Risk  (03/04/2023)   Overall Financial Resource Strain (CARDIA)    Difficulty of Paying Living Expenses: Not hard at all  Food Insecurity: No Food Insecurity (03/04/2023)   Hunger Vital Sign    Worried About Running Out of Food in the Last Year: Never true    Ran Out of Food in the Last Year: Never true  Transportation Needs: No Transportation Needs (03/04/2023)   PRAPARE - Administrator, Civil Service (Medical): No    Lack of Transportation (Non-Medical): No  Physical Activity: Insufficiently Active (03/04/2023)   Exercise Vital Sign    Days of Exercise per Week: 3 days    Minutes of Exercise per Session: 30 min  Stress: No Stress Concern Present (03/04/2023)   [employer] of Occupational Health - Occupational Stress Questionnaire    Feeling of Stress : Not at all  Social Connections: Moderately Integrated (03/04/2023)   Social Connection and Isolation Panel [NHANES]    Frequency of Communication with Friends and Family: More than three times a week    Frequency of Social Gatherings with Friends and Family: More than three times  a week    Attends Religious Services: More than 4 times per year    Active Member of Clubs or Organizations: No    Attends Banker Meetings: Never    Marital Status: Married  Catering manager Violence: Not At Risk (03/04/2023)   Humiliation, Afraid, Rape, and Kick questionnaire    Fear of Current or Ex-Partner: No    Emotionally Abused: No    Physically Abused: No    Sexually Abused: No    Outpatient Encounter Medications as of 11/20/2023  Medication Sig   aspirin EC 81 MG tablet Take by mouth. 2-3 a week   atorvastatin (LIPITOR) 20 MG tablet Take 1 tablet by mouth once daily   levothyroxine (SYNTHROID) 88 MCG tablet Take 1  tablet by mouth once daily   Magnesium 200 MG TABS Take by mouth in the morning and at bedtime.   meloxicam (MOBIC) 15 MG tablet Take 1 tablet (15 mg total) by mouth daily.   Multiple Vitamins-Minerals (HAIR SKIN AND NAILS FORMULA) TABS Take by mouth.   omeprazole (PRILOSEC) 40 MG capsule Take 1 capsule by mouth once daily   [DISCONTINUED] cephALEXin (KEFLEX) 500 MG capsule Take 1 capsule (500 mg total) by mouth 2 (two) times daily. (Patient not taking: Reported on 11/20/2023)   No facility-administered encounter medications on file as of 11/20/2023.    Allergies  Allergen Reactions   Naproxen Other (See Comments)    Irritates bladder    Review of Systems As per HPI  Objective:  BP 133/73   Pulse 71   Temp 98.5 F (36.9 C)   Ht 5\' 4"  (1.626 m)   Wt 189 lb (85.7 kg)   SpO2 96%   BMI 32.44 kg/m    Wt Readings from Last 3 Encounters:  11/20/23 189 lb (85.7 kg)  10/23/23 189 lb (85.7 kg)  03/04/23 190 lb (86.2 kg)   Physical Exam Constitutional:      General: She is awake. She is not in acute distress.    Appearance: Normal appearance. She is well-developed and well-groomed. She is not ill-appearing, toxic-appearing or diaphoretic.  Cardiovascular:     Rate and Rhythm: Normal rate and regular rhythm.     Pulses: Normal pulses.          Radial pulses are 2+ on the right side and 2+ on the left side.       Posterior tibial pulses are 2+ on the right side and 2+ on the left side.     Heart sounds: Normal heart sounds. No murmur heard.    No gallop.  Pulmonary:     Effort: Pulmonary effort is normal. No respiratory distress.     Breath sounds: Normal breath sounds. No stridor. No wheezing, rhonchi or rales.  Abdominal:     Tenderness: There is abdominal tenderness in the suprapubic area. There is no guarding or rebound.     Hernia: No hernia is present.  Genitourinary:    Exam position: Lithotomy position.     Adnexa:        Right: No mass, tenderness or fullness.          Left: No mass, tenderness or fullness.       Comments: Prolapsing tissue at vaginal opening  Musculoskeletal:     Cervical back: Full passive range of motion without pain and neck supple.     Right lower leg: No edema.     Left lower leg: No edema.  Skin:    General:  Skin is warm.     Capillary Refill: Capillary refill takes less than 2 seconds.  Neurological:     General: No focal deficit present.     Mental Status: She is alert, oriented to person, place, and time and easily aroused. Mental status is at baseline.     GCS: GCS eye subscore is 4. GCS verbal subscore is 5. GCS motor subscore is 6.     Motor: No weakness.  Psychiatric:        Attention and Perception: Attention and perception normal.        Mood and Affect: Mood and affect normal.        Speech: Speech normal.        Behavior: Behavior normal. Behavior is cooperative.        Thought Content: Thought content normal. Thought content does not include homicidal or suicidal ideation. Thought content does not include homicidal or suicidal plan.        Cognition and Memory: Cognition and memory normal.        Judgment: Judgment normal.     Results for orders placed or performed in visit on 10/23/23  Urine Culture   Specimen: Urine   CU  Result Value Ref Range   Urine Culture, Routine Final report (A)    Organism ID, Bacteria Klebsiella pneumoniae (A)    Antimicrobial Susceptibility Comment   Microscopic Examination   Urine  Result Value Ref Range   WBC, UA >30 (A) 0 - 5 /hpf   RBC, Urine >30 (A) 0 - 2 /hpf   Epithelial Cells (non renal) None seen 0 - 10 /hpf   Renal Epithel, UA None seen None seen /hpf   Bacteria, UA Many (A) None seen/Few   Yeast, UA None seen None seen  Urinalysis, Complete  Result Value Ref Range   Specific Gravity, UA >1.030 (H) 1.005 - 1.030   pH, UA 5.0 5.0 - 7.5   Color, UA Yellow Yellow   Appearance Ur Cloudy (A) Clear   Leukocytes,UA 2+ (A) Negative   Protein,UA Negative  Negative/Trace   Glucose, UA Negative Negative   Ketones, UA Trace (A) Negative   RBC, UA 2+ (A) Negative   Bilirubin, UA Negative Negative   Urobilinogen, Ur 0.2 0.2 - 1.0 mg/dL   Nitrite, UA Negative Negative   Microscopic Examination See below:        10/23/2023   10:55 AM 03/04/2023    2:21 PM 02/06/2023    8:39 AM 08/05/2022    8:45 AM 02/05/2022    9:25 AM  Depression screen PHQ 2/9  Decreased Interest 0 0 0 0   Down, Depressed, Hopeless 0 0 0 0 0  PHQ - 2 Score 0 0 0 0 0  Altered sleeping 0 0 0 0   Tired, decreased energy 0 0 0 1   Change in appetite 0 0 0 0   Feeling bad or failure about yourself  0 0 0 0   Trouble concentrating 0 0 0 0   Moving slowly or fidgety/restless 0 0 0 0   Suicidal thoughts 0 0 0 0   PHQ-9 Score 0 0 0 1   Difficult doing work/chores Not difficult at all Not difficult at all Not difficult at all Not difficult at all        10/23/2023   10:56 AM 02/06/2023    8:40 AM 08/05/2022    8:45 AM 07/31/2021    8:40 AM  GAD 7 : Generalized  Anxiety Score  Nervous, Anxious, on Edge 0 0 0 0  Control/stop worrying 0 0 0 0  Worry too much - different things 0 0 0 0  Trouble relaxing 0 0 0 0  Restless 0 0 0 0  Easily annoyed or irritable 0 0 0 0  Afraid - awful might happen 0 0 0 0  Total GAD 7 Score 0 0 0 0  Anxiety Difficulty Not difficult at all Not difficult at all Not difficult at all Not difficult at all   Pertinent labs & imaging results that were available during my care of the patient were reviewed by me and considered in my medical decision making.  Assessment & Plan:  Rosebella was seen today for bladder problems.  Diagnoses and all orders for this visit:  UTI symptoms Labs as below. Will communicate results to patient once available. Will await results to determine next steps.  Will retreat based on current UA. Will  -     Urinalysis, Routine w reflex microscopic -     Urine Culture -     sulfamethoxazole-trimethoprim (BACTRIM DS) 800-160 MG  tablet; Take 1 tablet by mouth 2 (two) times daily.  Cystocele with prolapse Possible cystocele vs rectocele. Referral placed as below for pelvic floor therapy and examination by urogyn.  -     Ambulatory referral to Urogynecology -     Ambulatory referral to Physical Therapy     Continue all other maintenance medications.  Follow up plan: Return if symptoms worsen or fail to improve.   Continue healthy lifestyle choices, including diet (rich in fruits, vegetables, and lean proteins, and low in salt and simple carbohydrates) and exercise (at least 30 minutes of moderate physical activity daily).  Written and verbal instructions provided   The above assessment and management plan was discussed with the patient. The patient verbalized understanding of and has agreed to the management plan. Patient is aware to call the clinic if they develop any new symptoms or if symptoms persist or worsen. Patient is aware when to return to the clinic for a follow-up visit. Patient educated on when it is appropriate to go to the emergency department.   Neale Burly, DNP-FNP [facility] [address] [place], [place] [address] [phone]

## 2023-11-25 ENCOUNTER — Other Ambulatory Visit: Payer: Self-pay | Admitting: Family Medicine

## 2023-11-25 DIAGNOSIS — R399 Unspecified symptoms and signs involving the genitourinary system: Secondary | ICD-10-CM

## 2023-11-26 LAB — URINE CULTURE

## 2023-11-26 NOTE — Progress Notes (Signed)
Positive for Klebsiella pneumoniae UTI. Provided appropriate abx. Follow up if symptoms continue.

## 2023-12-23 ENCOUNTER — Other Ambulatory Visit: Payer: Self-pay | Admitting: Family

## 2023-12-23 DIAGNOSIS — E039 Hypothyroidism, unspecified: Secondary | ICD-10-CM

## 2024-02-06 ENCOUNTER — Other Ambulatory Visit: Payer: Self-pay | Admitting: Family

## 2024-02-06 DIAGNOSIS — K219 Gastro-esophageal reflux disease without esophagitis: Secondary | ICD-10-CM

## 2024-02-09 ENCOUNTER — Ambulatory Visit (INDEPENDENT_AMBULATORY_CARE_PROVIDER_SITE_OTHER): Payer: Medicare HMO

## 2024-02-09 ENCOUNTER — Ambulatory Visit (INDEPENDENT_AMBULATORY_CARE_PROVIDER_SITE_OTHER): Payer: Medicare HMO | Admitting: Family

## 2024-02-09 ENCOUNTER — Encounter: Payer: Self-pay | Admitting: Family

## 2024-02-09 VITALS — BP 130/79 | HR 71 | Temp 97.5°F | Ht 64.0 in | Wt 195.0 lb

## 2024-02-09 DIAGNOSIS — M7730 Calcaneal spur, unspecified foot: Secondary | ICD-10-CM

## 2024-02-09 DIAGNOSIS — E039 Hypothyroidism, unspecified: Secondary | ICD-10-CM | POA: Diagnosis not present

## 2024-02-09 DIAGNOSIS — Z0001 Encounter for general adult medical examination with abnormal findings: Secondary | ICD-10-CM | POA: Diagnosis not present

## 2024-02-09 DIAGNOSIS — E785 Hyperlipidemia, unspecified: Secondary | ICD-10-CM | POA: Diagnosis not present

## 2024-02-09 DIAGNOSIS — E559 Vitamin D deficiency, unspecified: Secondary | ICD-10-CM | POA: Diagnosis not present

## 2024-02-09 DIAGNOSIS — K219 Gastro-esophageal reflux disease without esophagitis: Secondary | ICD-10-CM | POA: Diagnosis not present

## 2024-02-09 DIAGNOSIS — Z0000 Encounter for general adult medical examination without abnormal findings: Secondary | ICD-10-CM

## 2024-02-09 DIAGNOSIS — Z780 Asymptomatic menopausal state: Secondary | ICD-10-CM

## 2024-02-09 DIAGNOSIS — E669 Obesity, unspecified: Secondary | ICD-10-CM

## 2024-02-09 MED ORDER — ATORVASTATIN CALCIUM 20 MG PO TABS: 20.0000 mg | ORAL_TABLET | Freq: Every day | ORAL | 1 refills | Status: DC

## 2024-02-09 MED ORDER — MELOXICAM 15 MG PO TABS: 15.0000 mg | ORAL_TABLET | Freq: Every day | ORAL | 1 refills | Status: DC

## 2024-02-09 MED ORDER — PANTOPRAZOLE SODIUM 20 MG PO TBEC
20.0000 mg | DELAYED_RELEASE_TABLET | Freq: Every day | ORAL | 1 refills | Status: DC
Start: 2024-02-09 — End: 2024-08-02

## 2024-02-09 MED ORDER — LEVOTHYROXINE SODIUM 88 MCG PO TABS: 88.0000 ug | ORAL_TABLET | Freq: Every day | ORAL | 1 refills | Status: DC

## 2024-02-09 NOTE — Progress Notes (Signed)
 Subjective:    Patient ID: Diamond Wright, female    DOB: Aug 24, 1946, 78 y.o.   MRN: [id number]  Chief Complaint  Patient presents with   Medical Management of Chronic Issues   Pt presents to the office today for  CPD and chronic follow up.   She has seen Podiatry as needed for bilateral heel spurs. She takes Mobic  15 mg for this that helps.  Gastroesophageal Reflux She complains of belching, heartburn and a hoarse voice. This is a chronic problem. The current episode started more than 1 year ago. The problem occurs frequently. The problem has been waxing and waning. The symptoms are aggravated by lying down. Associated symptoms include fatigue. Risk factors include obesity. She has tried a PPI for the symptoms. The treatment provided mild relief.  Thyroid  Problem Presents for follow-up visit. Symptoms include fatigue and hoarse voice. Patient reports no constipation, diarrhea or dry skin. The symptoms have been stable.  Hyperlipidemia This is a chronic problem. The current episode started more than 1 year ago. The problem is controlled. Recent lipid tests were reviewed and are normal. Exacerbating diseases include obesity. Current antihyperlipidemic treatment includes statins. The current treatment provides moderate improvement of lipids. Risk factors for coronary artery disease include dyslipidemia, hypertension, a sedentary lifestyle and post-menopausal.      Review of Systems  Constitutional:  Positive for fatigue.  HENT:  Positive for hoarse voice.   Gastrointestinal:  Positive for heartburn. Negative for constipation and diarrhea.  All other systems reviewed and are negative.  Family History  Problem Relation Age of Onset   Seizures Mother    Congestive Heart Failure Mother    Diabetes Father    Heart disease Father    Lupus Daughter    Ulcerative colitis Son    Diabetes Sister    Cancer Brother    Thyroid  disease Sister    Prostate cancer Brother    Diabetes Brother     Diabetes Brother    Social History   Socioeconomic History   Marital status: Married    Spouse name: Ovidio Blower   Number of children: 4   Years of education: Not on file   Highest education level: GED or equivalent  Occupational History   Occupation: Retired  Tobacco Use   Smoking status: Never   Smokeless tobacco: Never  Vaping Use   Vaping status: Never Used  Substance and Sexual Activity   Alcohol use: Never   Drug use: Never   Sexual activity: Not Currently  Other Topics Concern   Not on file  Social History Narrative   Lives in a one level home with her husband   Children live in [place], [place], [place], and [place]   Social Drivers of Health   Financial Resource Strain: Low Risk  (03/04/2023)   Overall Financial Resource Strain (CARDIA)    Difficulty of Paying Living Expenses: Not hard at all  Food Insecurity: No Food Insecurity (03/04/2023)   Hunger Vital Sign    Worried About Running Out of Food in the Last Year: Never true    Ran Out of Food in the Last Year: Never true  Transportation Needs: No Transportation Needs (03/04/2023)   PRAPARE - Administrator, Civil Service (Medical): No    Lack of Transportation (Non-Medical): No  Physical Activity: Insufficiently Active (03/04/2023)   Exercise Vital Sign    Days of Exercise per Week: 3 days    Minutes of Exercise per Session: 30 min  Stress: No Stress Concern Present (03/04/2023)   [employer] of Occupational Health - Occupational Stress Questionnaire    Feeling of Stress : Not at all  Social Connections: Moderately Integrated (03/04/2023)   Social Connection and Isolation Panel [NHANES]    Frequency of Communication with Friends and Family: More than three times a week    Frequency of Social Gatherings with Friends and Family: More than three times a week    Attends Religious Services: More than 4 times per year    Active Member of [employer] or Organizations: No    Attends Banker  Meetings: Never    Marital Status: Married       Objective:   Physical Exam Vitals reviewed.  Constitutional:      General: She is not in acute distress.    Appearance: She is well-developed. She is obese.  HENT:     Head: Normocephalic and atraumatic.     Right Ear: Tympanic membrane normal.     Left Ear: Tympanic membrane normal.  Eyes:     Pupils: Pupils are equal, round, and reactive to light.  Neck:     Thyroid : No thyromegaly.  Cardiovascular:     Rate and Rhythm: Normal rate and regular rhythm.     Heart sounds: Normal heart sounds. No murmur heard. Pulmonary:     Effort: Pulmonary effort is normal. No respiratory distress.     Breath sounds: Normal breath sounds. No wheezing.  Abdominal:     General: Bowel sounds are normal. There is no distension.     Palpations: Abdomen is soft.     Tenderness: There is no abdominal tenderness.  Musculoskeletal:        General: No tenderness. Normal range of motion.     Cervical back: Normal range of motion and neck supple.  Skin:    General: Skin is warm and dry.  Neurological:     Mental Status: She is alert and oriented to person, place, and time.     Cranial Nerves: No cranial nerve deficit.     Deep Tendon Reflexes: Reflexes are normal and symmetric.  Psychiatric:        Behavior: Behavior normal.        Thought Content: Thought content normal.        Judgment: Judgment normal.        BP 130/79   Pulse 71   Temp (!) 97.5 F (36.4 C) (Temporal)   Ht 5\' 4"  (1.626 m)   Wt 195 lb (88.5 kg)   SpO2 97%   BMI 33.47 kg/m   Assessment & Plan:   ALIZAE HERVEY comes in today with chief complaint of Medical Management of Chronic Issues   Diagnosis and orders addressed:  1. Hypothyroidism, unspecified type - levothyroxine  (SYNTHROID ) 88 MCG tablet; Take 1 tablet (88 mcg total) by mouth daily.  Dispense: 90 tablet; Refill: 1 - CMP14+EGFR - CBC with Differential/Platelet - TSH  2. Gastroesophageal reflux disease  without esophagitis Will change prilosec 40 mg to Protonix  20 mg, may need to increase to 40 mg if symptoms do not improve.  -Diet discussed- Avoid fried, spicy, citrus foods, caffeine and alcohol -Do not eat 2-3 hours before bedtime -Encouraged small frequent meals -Avoid NSAID's -Follow up if symptoms worsen  - pantoprazole  (PROTONIX ) 20 MG tablet; Take 1 tablet (20 mg total) by mouth daily.  Dispense: 90 tablet; Refill: 1 - CMP14+EGFR - CBC with Differential/Platelet  3. Annual physical exam (Primary) - CMP14+EGFR -  CBC with Differential/Platelet - Lipid panel - TSH - VITAMIN D  25 Hydroxy (Vit-D Deficiency, Fractures)  4. Vitamin D  deficiency - CMP14+EGFR - CBC with Differential/Platelet - VITAMIN D  25 Hydroxy (Vit-D Deficiency, Fractures)  5. Obesity (BMI 30-39.9) - CMP14+EGFR - CBC with Differential/Platelet  6. Calcaneal spur, unspecified laterality - meloxicam  (MOBIC ) 15 MG tablet; Take 1 tablet (15 mg total) by mouth daily.  Dispense: 90 tablet; Refill: 1 - CMP14+EGFR - CBC with Differential/Platelet  7. Dyslipidemia - atorvastatin  (LIPITOR) 20 MG tablet; Take 1 tablet (20 mg total) by mouth daily.  Dispense: 90 tablet; Refill: 1 - CMP14+EGFR - CBC with Differential/Platelet - Lipid panel  8. Post-menopause - DG [facility] DEXA - CMP14+EGFR - CBC with Differential/Platelet   Labs pending Continue current medications  Health Maintenance reviewed Diet and exercise encouraged  Follow up plan: 6 months  Tommas Fragmin, FNP

## 2024-02-09 NOTE — Patient Instructions (Signed)
 GERD in Adults: What to Know  Gastroesophageal reflux (GER) is when acid from your stomach flows up into your esophagus. Your esophagus is the part of your body that moves food from your mouth to your stomach. Normally, food goes down and stays in your stomach to be digested. But with GER, food and stomach acid may go back up. You may have a disease called gastroesophageal reflux disease (GERD) if the reflux: Happens often. Causes very bad symptoms. Makes your esophagus sore and swollen. Over time, GERD can make small holes called ulcers in the lining of your esophagus. What are the causes? GERD is caused by a problem with the muscle between your esophagus and stomach. This muscle is called the lower esophageal sphincter (LES). When it's weak or not normal, it doesn't close like it should. This means food and stomach acid can go back up into your esophagus. The muscle can be weak if: You smoke or use products with tobacco in them. You're pregnant. You have a type of hernia called a hiatal hernia. You eat certain foods and drinks. These include: Alcohol. Coffee. Chocolate. Onions. Peppermint. What increases the risk? Being overweight. Having a disease that affects your connective tissue. Taking NSAIDs, such as ibuprofen. What are the signs or symptoms? Heartburn. Trouble swallowing. Pain when you swallow. The feeling of having a lump in your throat. A bitter taste in your mouth. Bad breath. Having an upset or bloated stomach. Burping. Chest pain. Other conditions can also cause chest pain. Make sure you see your health care provider if you have chest pain. Wheezing. This is when you make high-pitched whistling sounds when you breathe, most often when you breathe out. A long-term cough or a cough at night. How is this diagnosed? GERD may be diagnosed based on your medical history and a physical exam. You may also have tests. These may include: An endoscopy. This test looks at your  stomach and esophagus with a small camera. A barium swallow test. This shows the shape and size of your esophagus and how well it's working. Tests of your esophagus to check for: Acid levels. Pressure. How is this treated? Treatment may depend on how bad your symptoms are. It may include: Changes to your diet and daily life. Medicines. Surgery. Follow these instructions at home: Eating and drinking Follow an eating plan as told by your provider. You may need to avoid certain foods and drinks. These may include: Coffee and tea, with or without caffeine. Alcohol. Energy drinks and sports drinks. Fizzy drinks or sodas. Chocolate and cocoa. Peppermint and mint flavorings. Garlic and onions. Horseradish. Spicy and acidic foods. These include: Peppers. Chili powder and curry powder. Vinegar. Hot sauces and BBQ sauce. Citrus fruits and juices. These include: Oranges. Lemons. Limes. Tomato-based foods. These include: Red sauce and pizza with red sauce. Chili. Salsa. Fried and fatty foods. These include: Donuts. [place] fries. Potato chips. High-fat dressings. High-fat meats. These include: Hot dogs and sausage. Rib eye steak. Ham and bacon. High-fat dairy items. These include: Whole milk. Butter. Cream cheese. Eat small meals often. Avoid eating big meals. Avoid drinking lots of liquid with your meals. Try not to eat meals during the 2-3 hours before bedtime. Try not to lie down right after you eat. Do not exercise right after you eat. Lifestyle  If you're overweight, lose an amount of weight that's healthy for you. Ask your provider about a safe weight loss goal. Do not smoke, vape, or use nicotine or tobacco. Wear  loose clothes. Do not wear things that are tight around your waist. When you sleep, try: Raising the head of your bed about 6 inches (15 cm). You can use a wedge to do this. Lying down on your left side. Try to lower your stress. If you need help doing  this, ask your provider. General instructions Take your medicines only as told. Do not take aspirin or ibuprofen unless you're told to. Watch for any changes in your symptoms. Do not bend over if it makes your symptoms worse. Contact a health care provider if: You have new symptoms. You have trouble: Drinking. Swallowing. Eating. It hurts to swallow. You have wheezing. You have a cough that won't go away. Your voice is hoarse. Your symptoms don't get better with treatment. Get help right away if: You have pain all of a sudden in your: Arm. Neck. Jaw. Teeth. Back. You feel sweaty, dizzy, or light-headed all of a sudden. You faint. You have chest pain or shortness of breath. You vomit and the vomit is: Green, yellow, or black. Looks like blood or coffee grounds. Your poop is red, bloody, or black. These symptoms may be an emergency. Call 911 right away. Do not wait to see if the symptoms will go away. Do not drive yourself to the hospital. This information is not intended to replace advice given to you by your health care provider. Make sure you discuss any questions you have with your health care provider. Document Revised: 10/28/2023 Document Reviewed: 05/14/2023 Elsevier Patient Education  2024 [employer].

## 2024-02-10 ENCOUNTER — Other Ambulatory Visit: Payer: Self-pay | Admitting: Family

## 2024-02-10 DIAGNOSIS — M8580 Other specified disorders of bone density and structure, unspecified site: Secondary | ICD-10-CM | POA: Insufficient documentation

## 2024-02-10 LAB — CMP14+EGFR
ALT: 12 [IU]/L (ref 0–32)
AST: 17 [IU]/L (ref 0–40)
Albumin: 4 g/dL (ref 3.8–4.8)
Alkaline Phosphatase: 107 [IU]/L (ref 44–121)
BUN/Creatinine Ratio: 17 (ref 12–28)
BUN: 12 mg/dL (ref 8–27)
Bilirubin Total: 0.3 mg/dL (ref 0.0–1.2)
CO2: 23 mmol/L (ref 20–29)
Calcium: 9.1 mg/dL (ref 8.7–10.3)
Chloride: 107 mmol/L — ABNORMAL HIGH (ref 96–106)
Creatinine, Ser: 0.71 mg/dL (ref 0.57–1.00)
Globulin, Total: 2.5 g/dL (ref 1.5–4.5)
Glucose: 98 mg/dL (ref 70–99)
Potassium: 4.2 mmol/L (ref 3.5–5.2)
Sodium: 143 mmol/L (ref 134–144)
Total Protein: 6.5 g/dL (ref 6.0–8.5)
eGFR: 88 mL/min/{1.73_m2} (ref >59)

## 2024-02-10 LAB — LIPID PANEL
Chol/HDL Ratio: 4.3 {ratio} (ref 0.0–4.4)
Cholesterol, Total: 120 mg/dL (ref 100–199)
HDL: 28 mg/dL — ABNORMAL LOW (ref >39)
LDL Chol Calc (NIH): 51 mg/dL (ref 0–99)
Triglycerides: 256 mg/dL — ABNORMAL HIGH (ref 0–149)
VLDL Cholesterol Cal: 41 mg/dL — ABNORMAL HIGH (ref 5–40)

## 2024-02-10 LAB — CBC WITH DIFFERENTIAL/PLATELET
Basophils Absolute: 0.1 10*3/uL (ref 0.0–0.2)
Basos: 1 %
EOS (ABSOLUTE): 0.2 10*3/uL (ref 0.0–0.4)
Eos: 2 %
Hematocrit: 42.7 % (ref 34.0–46.6)
Hemoglobin: 13.7 g/dL (ref 11.1–15.9)
Immature Grans (Abs): 0 10*3/uL (ref 0.0–0.1)
Immature Granulocytes: 0 %
Lymphocytes Absolute: 2 10*3/uL (ref 0.7–3.1)
Lymphs: 25 %
MCH: 30 pg (ref 26.6–33.0)
MCHC: 32.1 g/dL (ref 31.5–35.7)
MCV: 93 fL (ref 79–97)
Monocytes Absolute: 0.6 10*3/uL (ref 0.1–0.9)
Monocytes: 7 %
Neutrophils Absolute: 5.3 10*3/uL (ref 1.4–7.0)
Neutrophils: 65 %
Platelets: 200 10*3/uL (ref 150–450)
RBC: 4.57 x10E6/uL (ref 3.77–5.28)
RDW: 12.6 % (ref 11.7–15.4)
WBC: 8.1 10*3/uL (ref 3.4–10.8)

## 2024-02-10 LAB — VITAMIN D 25 HYDROXY (VIT D DEFICIENCY, FRACTURES): Vit D, 25-Hydroxy: 46.8 ng/mL (ref 30.0–100.0)

## 2024-02-10 LAB — TSH: TSH: 1.36 u[IU]/mL (ref 0.450–4.500)

## 2024-05-03 ENCOUNTER — Ambulatory Visit (INDEPENDENT_AMBULATORY_CARE_PROVIDER_SITE_OTHER): Admitting: Nurse Practitioner

## 2024-05-03 ENCOUNTER — Encounter: Payer: Self-pay | Admitting: Nurse Practitioner

## 2024-05-03 VITALS — BP 141/76 | HR 89 | Temp 98.0°F | Ht 64.0 in | Wt 195.2 lb

## 2024-05-03 DIAGNOSIS — R300 Dysuria: Secondary | ICD-10-CM | POA: Diagnosis not present

## 2024-05-03 DIAGNOSIS — N3001 Acute cystitis with hematuria: Secondary | ICD-10-CM | POA: Insufficient documentation

## 2024-05-03 LAB — URINALYSIS, ROUTINE W REFLEX MICROSCOPIC
Bilirubin, UA: NEGATIVE
Glucose, UA: NEGATIVE
Ketones, UA: NEGATIVE
Nitrite, UA: NEGATIVE
Protein,UA: NEGATIVE
Specific Gravity, UA: 1.015 (ref 1.005–1.030)
Urobilinogen, Ur: 0.2 mg/dL (ref 0.2–1.0)
pH, UA: 5.5 (ref 5.0–7.5)

## 2024-05-03 LAB — MICROSCOPIC EXAMINATION
Bacteria, UA: NONE SEEN
RBC, Urine: NONE SEEN /HPF (ref 0–2)
Renal Epithel, UA: NONE SEEN /HPF
Yeast, UA: NONE SEEN

## 2024-05-03 MED ORDER — DOXYCYCLINE HYCLATE 100 MG PO CAPS: 100.0000 mg | ORAL_CAPSULE | Freq: Two times a day (BID) | ORAL | 0 refills | Status: DC

## 2024-05-03 NOTE — Progress Notes (Signed)
 Acute Office Visit  Subjective:     Patient ID: Diamond Wright, female    DOB: December 31, 1945, 78 y.o.   MRN: [id number]  Chief Complaint  Patient presents with   Urinary Frequency    Symptoms started 2-3 days ago   Dysuria    HPI  Diamond Wright is a 78 y.o. female is a 71 yrs female presents 05/03/2024 who complains of urinary frequency, urgency , flank pain and dysuria x 4 days, without, fever, chills, or abnormal vaginal discharge or bleeding.  Tried Monistat and cranberry juice with minimal relief.   Active Ambulatory Problems    Diagnosis Date Noted   Dyslipidemia 05/06/2017   Gastroesophageal reflux disease without esophagitis 06/10/2019   Hypothyroidism 05/06/2017   Vitamin D  deficiency 05/06/2017   Heel spur 07/31/2021   Obesity (BMI 30-39.9) 07/31/2021   Osteopenia 02/10/2024   Acute cystitis with hematuria 05/03/2024   Dysuria 05/03/2024   Resolved Ambulatory Problems    Diagnosis Date Noted   Educated about COVID-19 virus infection 01/29/2021   Past Medical History:  Diagnosis Date   Ankle fracture    Cataract    GERD (gastroesophageal reflux disease)    Heel spur, right    Thyroid  disease     Review of Systems  Constitutional:  Negative for chills and fever.  HENT:  Negative for ear pain and sore throat.   Respiratory:  Negative for cough and wheezing.   Cardiovascular:  Negative for chest pain and leg swelling.  Genitourinary:  Positive for dysuria, flank pain, frequency and urgency.  Skin:  Negative for itching and rash.  Neurological:  Negative for dizziness and headaches.   Urine dipstick shows positive for WBC's and positive for RBC's.  Micro exam: 0-5 WBC's per HPF and 0-10 epithelial cell  Negative unless indicated in HPI    Objective:    BP (!) 141/76   Pulse 89   Temp 98 F (36.7 C) (Temporal)   Ht 5\' 4"  (1.626 m)   Wt 195 lb 3.2 oz (88.5 kg)   SpO2 96%   BMI 33.51 kg/m  BP Readings from Last 3 Encounters:  05/03/24 (!) 141/76   02/09/24 130/79  11/20/23 133/73   Wt Readings from Last 3 Encounters:  05/03/24 195 lb 3.2 oz (88.5 kg)  02/09/24 195 lb (88.5 kg)  11/20/23 189 lb (85.7 kg)      Physical Exam Vitals and nursing note reviewed.  Constitutional:      General: She is not in acute distress. HENT:     Head: Normocephalic and atraumatic.     Nose: Nose normal.  Eyes:     Extraocular Movements: Extraocular movements intact.     Conjunctiva/sclera: Conjunctivae normal.     Pupils: Pupils are equal, round, and reactive to light.  Cardiovascular:     Heart sounds: Normal heart sounds.  Pulmonary:     Effort: Pulmonary effort is normal.     Breath sounds: Normal breath sounds.  Abdominal:     General: Bowel sounds are normal.     Palpations: Abdomen is soft. There is no mass.     Tenderness: There is no abdominal tenderness. There is no right CVA tenderness, left CVA tenderness or guarding.  Musculoskeletal:        General: Normal range of motion.     Right lower leg: No edema.     Left lower leg: No edema.  Skin:    General: Skin is warm and dry.  Findings: No rash.  Neurological:     Mental Status: She is alert and oriented to person, place, and time.  Psychiatric:        Mood and Affect: Mood normal.        Behavior: Behavior normal.        Thought Content: Thought content normal.        Judgment: Judgment normal.     Results for orders placed or performed in visit on 05/03/24  Microscopic Examination   Urine  Result Value Ref Range   WBC, UA 0-5 0 - 5 /hpf   RBC, Urine None seen 0 - 2 /hpf   Epithelial Cells (non renal) 0-10 0 - 10 /hpf   Renal Epithel, UA None seen None seen /hpf   Bacteria, UA None seen None seen/Few   Yeast, UA None seen None seen  Urinalysis, Routine w reflex microscopic  Result Value Ref Range   Specific Gravity, UA 1.015 1.005 - 1.030   pH, UA 5.5 5.0 - 7.5   Color, UA Yellow Yellow   Appearance Ur Clear Clear   Leukocytes,UA Trace (A) Negative    Protein,UA Negative Negative/Trace   Glucose, UA Negative Negative   Ketones, UA Negative Negative   RBC, UA 1+ (A) Negative   Bilirubin, UA Negative Negative   Urobilinogen, Ur 0.2 0.2 - 1.0 mg/dL   Nitrite, UA Negative Negative   Microscopic Examination See below:         Assessment & Plan:  Dysuria -     Urinalysis, Routine w reflex microscopic -     Doxycycline Hyclate; Take 1 capsule (100 mg total) by mouth 2 (two) times daily.  Dispense: 14 capsule; Refill: 0 -     Urine Culture  Acute cystitis with hematuria -     Doxycycline Hyclate; Take 1 capsule (100 mg total) by mouth 2 (two) times daily.  Dispense: 14 capsule; Refill: 0 -     Urine Culture  Other orders -     Microscopic Examination  Diamond Wright is 78 yrs old caucasian female seen for acute cystitis Will treat empirically while waiting for culture result Doxy 100 mg BID for 7-days; Diflucan 150 1 tab take once done with ATB also push fluids, may use Pyridium OTC prn. Call or return to clinic prn if these symptoms worsen or fail to improve as anticipated.   The above assessment and management plan was discussed with the patient. The patient verbalized understanding of and has agreed to the management plan. Patient is aware to call the clinic if they develop any new symptoms or if symptoms persist or worsen. Patient is aware when to return to the clinic for a follow-up visit. Patient educated on when it is appropriate to go to the emergency department.  Return if symptoms worsen or fail to improve.  Aydrien Froman [place], DNP [facility] [address] [place], [place] [address] [phone]  Note: This document was prepared by [employer] voice dictation technology and any errors that results from this process are unintentional.

## 2024-05-05 ENCOUNTER — Other Ambulatory Visit: Payer: Self-pay | Admitting: Family

## 2024-05-05 DIAGNOSIS — K219 Gastro-esophageal reflux disease without esophagitis: Secondary | ICD-10-CM

## 2024-05-05 LAB — URINE CULTURE

## 2024-07-28 ENCOUNTER — Ambulatory Visit: Payer: Self-pay | Admitting: Family Medicine

## 2024-07-28 ENCOUNTER — Ambulatory Visit (INDEPENDENT_AMBULATORY_CARE_PROVIDER_SITE_OTHER): Admitting: Family Medicine

## 2024-07-28 ENCOUNTER — Encounter: Payer: Self-pay | Admitting: Family Medicine

## 2024-07-28 VITALS — BP 118/70 | HR 66 | Temp 96.9°F | Ht 64.0 in | Wt 186.0 lb

## 2024-07-28 DIAGNOSIS — R300 Dysuria: Secondary | ICD-10-CM | POA: Diagnosis not present

## 2024-07-28 DIAGNOSIS — N3001 Acute cystitis with hematuria: Secondary | ICD-10-CM

## 2024-07-28 LAB — MICROSCOPIC EXAMINATION
Epithelial Cells (non renal): NONE SEEN /HPF (ref 0–10)
Renal Epithel, UA: NONE SEEN /HPF
WBC, UA: >30 /HPF — AB (ref 0–5)

## 2024-07-28 LAB — URINALYSIS, ROUTINE W REFLEX MICROSCOPIC
Bilirubin, UA: NEGATIVE
Glucose, UA: NEGATIVE
Nitrite, UA: POSITIVE — AB
Specific Gravity, UA: >1.03 — ABNORMAL HIGH (ref 1.005–1.030)
Urobilinogen, Ur: 0.2 mg/dL (ref 0.2–1.0)
pH, UA: 5.5 (ref 5.0–7.5)

## 2024-07-28 MED ORDER — SULFAMETHOXAZOLE-TRIMETHOPRIM 800-160 MG PO TABS: 1.0000 | ORAL_TABLET | Freq: Two times a day (BID) | ORAL | 0 refills | Status: AC

## 2024-07-28 NOTE — Progress Notes (Signed)
 Subjective:  Patient ID: Diamond Wright, female    DOB: [birth year], 78 y.o.   MRN: [id number]  Patient Care Team: Lavell Bari LABOR, FNP as PCP - General (Family Medicine)   Chief Complaint:  Dysuria (X 3-4 days )   HPI: Diamond Wright is a 78 y.o. female presenting on 07/28/2024 for Dysuria (X 3-4 days )   Diamond Wright is a 78 year old female who presents with a burning sensation in the bladder and urinary frequency.  She has been experiencing a burning sensation inside her bladder for the past couple of days, along with increased urinary frequency. There is no fever, chills, or hematuria. She feels irritable and had poor sleep the previous night. There is a noticeable odor to her urine.  Approximately three months ago, she experienced a similar episode and was evaluated for the same symptoms. At that time, it was determined not to be a bacterial infection. She has a history of urinary tract infections caused by E. Coli and Klebsiella, previously treated with antibiotics such as Augmentin and Bactrim .  She is allergic to naproxen.       Relevant past medical, surgical, family, and social history reviewed and updated as indicated.  Allergies and medications reviewed and updated. Data reviewed: Chart in Epic.   Past Medical History:  Diagnosis Date   Ankle fracture    Bilateral   Cataract    GERD (gastroesophageal reflux disease)    Heel spur, right    Osteopenia    Thyroid  disease    Vitamin D  deficiency     Past Surgical History:  Procedure Laterality Date   ABDOMINAL HYSTERECTOMY     CATARACT EXTRACTION, BILATERAL     SPINE SURGERY      Social History   Socioeconomic History   Marital status: Married    Spouse name: Ivonne   Number of children: 4   Years of education: Not on file   Highest education level: GED or equivalent  Occupational History   Occupation: Retired  Tobacco Use   Smoking status: Never   Smokeless tobacco: Never  Vaping Use    Vaping status: Never Used  Substance and Sexual Activity   Alcohol use: Never   Drug use: Never   Sexual activity: Not Currently  Other Topics Concern   Not on file  Social History Narrative   Lives in a one level home with her husband   Children live in [place], [place], [place], and [place]   Social Drivers of Health   Financial Resource Strain: Low Risk  (03/04/2023)   Overall Financial Resource Strain (CARDIA)    Difficulty of Paying Living Expenses: Not hard at all  Food Insecurity: No Food Insecurity (03/04/2023)   Hunger Vital Sign    Worried About Running Out of Food in the Last Year: Never true    Ran Out of Food in the Last Year: Never true  Transportation Needs: No Transportation Needs (03/04/2023)   PRAPARE - Administrator, Civil Service (Medical): No    Lack of Transportation (Non-Medical): No  Physical Activity: Insufficiently Active (03/04/2023)   Exercise Vital Sign    Days of Exercise per Week: 3 days    Minutes of Exercise per Session: 30 min  Stress: No Stress Concern Present (03/04/2023)   [employer] of Occupational Health - Occupational Stress Questionnaire    Feeling of Stress : Not at all  Social Connections: Moderately Integrated (03/04/2023)  Social Connection and Isolation Panel    Frequency of Communication with Friends and Family: More than three times a week    Frequency of Social Gatherings with Friends and Family: More than three times a week    Attends Religious Services: More than 4 times per year    Active Member of [employer] or Organizations: No    Attends Banker Meetings: Never    Marital Status: Married  Catering manager Violence: Not At Risk (03/04/2023)   Humiliation, Afraid, Rape, and Kick questionnaire    Fear of Current or Ex-Partner: No    Emotionally Abused: No    Physically Abused: No    Sexually Abused: No    Outpatient Encounter Medications as of 07/28/2024  Medication Sig   aspirin EC 81 MG tablet  Take by mouth. 2-3 a week   atorvastatin  (LIPITOR) 20 MG tablet Take 1 tablet (20 mg total) by mouth daily.   cholecalciferol (VITAMIN D3) 25 MCG (1000 UNIT) tablet Take 1,000 Units by mouth daily.   levothyroxine  (SYNTHROID ) 88 MCG tablet Take 1 tablet (88 mcg total) by mouth daily.   Magnesium 200 MG TABS Take 800 mg by mouth in the morning and at bedtime.   meloxicam  (MOBIC ) 15 MG tablet Take 1 tablet (15 mg total) by mouth daily.   Multiple Vitamins-Minerals (HAIR SKIN AND NAILS FORMULA) TABS Take by mouth.   pantoprazole  (PROTONIX ) 20 MG tablet Take 1 tablet (20 mg total) by mouth daily.   sulfamethoxazole -trimethoprim  (BACTRIM  DS) 800-160 MG tablet Take 1 tablet by mouth 2 (two) times daily for 7 days.   [DISCONTINUED] doxycycline  (VIBRAMYCIN ) 100 MG capsule Take 1 capsule (100 mg total) by mouth 2 (two) times daily.   No facility-administered encounter medications on file as of 07/28/2024.    Allergies  Allergen Reactions   Naproxen Other (See Comments)    Irritates bladder    Pertinent ROS per HPI, otherwise unremarkable      Objective:  BP 118/70   Pulse 66   Temp (!) 96.9 F (36.1 C)   Ht 5' 4 (1.626 m)   Wt 186 lb (84.4 kg)   SpO2 99%   BMI 31.93 kg/m    Wt Readings from Last 3 Encounters:  07/28/24 186 lb (84.4 kg)  05/03/24 195 lb 3.2 oz (88.5 kg)  02/09/24 195 lb (88.5 kg)    Physical Exam Vitals and nursing note reviewed.  Constitutional:      General: She is not in acute distress.    Appearance: Normal appearance. She is well-developed and well-groomed. She is obese. She is not ill-appearing, toxic-appearing or diaphoretic.  HENT:     Head: Normocephalic and atraumatic.     Jaw: There is normal jaw occlusion.     Right Ear: Hearing normal.     Left Ear: Hearing normal.     Nose: Nose normal.     Mouth/Throat:     Lips: Pink.     Mouth: Mucous membranes are moist.     Pharynx: Oropharynx is clear. Uvula midline.  Eyes:     General: Lids are  normal.     Extraocular Movements: Extraocular movements intact.     Conjunctiva/sclera: Conjunctivae normal.     Pupils: Pupils are equal, round, and reactive to light.  Neck:     Thyroid : No thyroid  mass, thyromegaly or thyroid  tenderness.     Vascular: No carotid bruit or JVD.     Trachea: Trachea and phonation normal.  Cardiovascular:  Rate and Rhythm: Normal rate and regular rhythm.     Chest Wall: PMI is not displaced.     Pulses: Normal pulses.     Heart sounds: Normal heart sounds. No murmur heard.    No friction rub. No gallop.  Pulmonary:     Effort: Pulmonary effort is normal. No respiratory distress.     Breath sounds: Normal breath sounds. No wheezing.  Abdominal:     General: Bowel sounds are normal. There is no distension or abdominal bruit.     Palpations: Abdomen is soft. There is no hepatomegaly or splenomegaly.     Tenderness: There is no abdominal tenderness. There is no right CVA tenderness or left CVA tenderness.     Hernia: No hernia is present.  Musculoskeletal:        General: Normal range of motion.     Cervical back: Normal range of motion and neck supple.     Right lower leg: No edema.     Left lower leg: No edema.  Lymphadenopathy:     Cervical: No cervical adenopathy.  Skin:    General: Skin is warm and dry.     Capillary Refill: Capillary refill takes less than 2 seconds.     Coloration: Skin is not cyanotic, jaundiced or pale.     Findings: No rash.  Neurological:     General: No focal deficit present.     Mental Status: She is alert and oriented to person, place, and time.     Sensory: Sensation is intact.     Motor: Motor function is intact.     Coordination: Coordination is intact.     Gait: Gait is intact.     Deep Tendon Reflexes: Reflexes are normal and symmetric.  Psychiatric:        Attention and Perception: Attention and perception normal.        Mood and Affect: Mood and affect normal.        Speech: Speech normal.         Behavior: Behavior normal. Behavior is cooperative.        Thought Content: Thought content normal.        Cognition and Memory: Cognition and memory normal.        Judgment: Judgment normal.         Results for orders placed or performed in visit on 05/03/24  Microscopic Examination   Collection Time: 05/03/24  2:09 PM   Urine  Result Value Ref Range   WBC, UA 0-5 0 - 5 /hpf   RBC, Urine None seen 0 - 2 /hpf   Epithelial Cells (non renal) 0-10 0 - 10 /hpf   Renal Epithel, UA None seen None seen /hpf   Bacteria, UA None seen None seen/Few   Yeast, UA None seen None seen  Urinalysis, Routine w reflex microscopic   Collection Time: 05/03/24  2:09 PM  Result Value Ref Range   Specific Gravity, UA 1.015 1.005 - 1.030   pH, UA 5.5 5.0 - 7.5   Color, UA Yellow Yellow   Appearance Ur Clear Clear   Leukocytes,UA Trace (A) Negative   Protein,UA Negative Negative/Trace   Glucose, UA Negative Negative   Ketones, UA Negative Negative   RBC, UA 1+ (A) Negative   Bilirubin, UA Negative Negative   Urobilinogen, Ur 0.2 0.2 - 1.0 mg/dL   Nitrite, UA Negative Negative   Microscopic Examination See below:   Urine Culture   Collection Time: 05/03/24  3:55  PM   Specimen: Urine   UR  Result Value Ref Range   Urine Culture, Routine Final report    Organism ID, Bacteria Comment        Pertinent labs & imaging results that were available during my care of the patient were reviewed by me and considered in my medical decision making.  Assessment & Plan:  Diamond Wright was seen today for dysuria.  Diagnoses and all orders for this visit:  Dysuria -     Urine Culture -     Urinalysis, Routine w reflex microscopic  Acute cystitis with hematuria -     sulfamethoxazole -trimethoprim  (BACTRIM  DS) 800-160 MG tablet; Take 1 tablet by mouth 2 (two) times daily for 7 days.     Urinary tract infection Acute urinary tract infection with symptoms of dysuria, urinary frequency, and malodorous urine.  Positive nitrites, leukocytes, and hematuria indicate infection. Previous cultures showed E. coli and Klebsiella infections. Current treatment plan based on past culture results and antibiotic sensitivity. Bactrim  is chosen for its effectiveness against the identified pathogens. If culture results indicate resistance, antibiotic will be adjusted accordingly. - Prescribe Bactrim  800/160 mg twice a day for 7 days. - Order urine culture to confirm bacterial sensitivity. - Advise to increase water intake and avoid bladder irritants such as caffeine. - Instruct to minimize caffeine intake to one cup of coffee per day. - Advise to wipe front to back to prevent infection. - Recommend taking showers instead of baths to reduce infection risk. - Instruct to report any new or worsening symptoms.          Continue all other maintenance medications.  Follow up plan: Return if symptoms worsen or fail to improve.   Continue healthy lifestyle choices, including diet (rich in fruits, vegetables, and lean proteins, and low in salt and simple carbohydrates) and exercise (at least 30 minutes of moderate physical activity daily).  Educational handout given for UTI  The above assessment and management plan was discussed with the patient. The patient verbalized understanding of and has agreed to the management plan. Patient is aware to call the clinic if they develop any new symptoms or if symptoms persist or worsen. Patient is aware when to return to the clinic for a follow-up visit. Patient educated on when it is appropriate to go to the emergency department.   Diamond Bruns, FNP-C [facility] [phone]

## 2024-07-30 LAB — URINE CULTURE

## 2024-07-31 ENCOUNTER — Other Ambulatory Visit: Payer: Self-pay | Admitting: Family

## 2024-07-31 DIAGNOSIS — K219 Gastro-esophageal reflux disease without esophagitis: Secondary | ICD-10-CM

## 2024-07-31 DIAGNOSIS — M7730 Calcaneal spur, unspecified foot: Secondary | ICD-10-CM

## 2024-08-05 DIAGNOSIS — Z833 Family history of diabetes mellitus: Secondary | ICD-10-CM | POA: Diagnosis not present

## 2024-08-05 DIAGNOSIS — E039 Hypothyroidism, unspecified: Secondary | ICD-10-CM | POA: Diagnosis not present

## 2024-08-05 DIAGNOSIS — Z791 Long term (current) use of non-steroidal anti-inflammatories (NSAID): Secondary | ICD-10-CM | POA: Diagnosis not present

## 2024-08-05 DIAGNOSIS — Z823 Family history of stroke: Secondary | ICD-10-CM | POA: Diagnosis not present

## 2024-08-05 DIAGNOSIS — Z8249 Family history of ischemic heart disease and other diseases of the circulatory system: Secondary | ICD-10-CM | POA: Diagnosis not present

## 2024-08-05 DIAGNOSIS — I739 Peripheral vascular disease, unspecified: Secondary | ICD-10-CM | POA: Diagnosis not present

## 2024-08-05 DIAGNOSIS — M1990 Unspecified osteoarthritis, unspecified site: Secondary | ICD-10-CM | POA: Diagnosis not present

## 2024-08-05 DIAGNOSIS — E669 Obesity, unspecified: Secondary | ICD-10-CM | POA: Diagnosis not present

## 2024-08-05 DIAGNOSIS — K219 Gastro-esophageal reflux disease without esophagitis: Secondary | ICD-10-CM | POA: Diagnosis not present

## 2024-08-05 DIAGNOSIS — E785 Hyperlipidemia, unspecified: Secondary | ICD-10-CM | POA: Diagnosis not present

## 2024-08-05 DIAGNOSIS — R030 Elevated blood-pressure reading, without diagnosis of hypertension: Secondary | ICD-10-CM | POA: Diagnosis not present

## 2024-08-05 DIAGNOSIS — Z809 Family history of malignant neoplasm, unspecified: Secondary | ICD-10-CM | POA: Diagnosis not present

## 2024-08-10 ENCOUNTER — Ambulatory Visit (INDEPENDENT_AMBULATORY_CARE_PROVIDER_SITE_OTHER): Payer: Medicare HMO | Admitting: Family

## 2024-08-10 ENCOUNTER — Encounter: Payer: Self-pay | Admitting: Family

## 2024-08-10 VITALS — BP 120/76 | HR 75 | Temp 97.0°F | Ht 64.0 in | Wt 185.8 lb

## 2024-08-10 DIAGNOSIS — E785 Hyperlipidemia, unspecified: Secondary | ICD-10-CM | POA: Diagnosis not present

## 2024-08-10 DIAGNOSIS — E669 Obesity, unspecified: Secondary | ICD-10-CM | POA: Diagnosis not present

## 2024-08-10 DIAGNOSIS — K219 Gastro-esophageal reflux disease without esophagitis: Secondary | ICD-10-CM | POA: Diagnosis not present

## 2024-08-10 DIAGNOSIS — E559 Vitamin D deficiency, unspecified: Secondary | ICD-10-CM | POA: Diagnosis not present

## 2024-08-10 DIAGNOSIS — M7730 Calcaneal spur, unspecified foot: Secondary | ICD-10-CM | POA: Diagnosis not present

## 2024-08-10 DIAGNOSIS — E039 Hypothyroidism, unspecified: Secondary | ICD-10-CM

## 2024-08-10 DIAGNOSIS — N3281 Overactive bladder: Secondary | ICD-10-CM | POA: Diagnosis not present

## 2024-08-10 MED ORDER — ATORVASTATIN CALCIUM 20 MG PO TABS: 20.0000 mg | ORAL_TABLET | Freq: Every day | ORAL | 1 refills | Status: AC

## 2024-08-10 MED ORDER — OXYBUTYNIN CHLORIDE ER 5 MG PO TB24: 5.0000 mg | ORAL_TABLET | Freq: Every day | ORAL | 2 refills | Status: AC

## 2024-08-10 MED ORDER — PANTOPRAZOLE SODIUM 20 MG PO TBEC: 20.0000 mg | DELAYED_RELEASE_TABLET | Freq: Every day | ORAL | 2 refills | Status: AC

## 2024-08-10 MED ORDER — MELOXICAM 15 MG PO TABS: 15.0000 mg | ORAL_TABLET | Freq: Every day | ORAL | 2 refills | Status: AC

## 2024-08-10 NOTE — Progress Notes (Signed)
 Subjective:    Patient ID: Diamond Wright, female    DOB: Oct 18, 1946, 78 y.o.   MRN: [id number]  Chief Complaint  Patient presents with   Medical Management of Chronic Issues   Pt presents to the office today for chronic follow up.   She has seen Podiatry as needed for bilateral heel spurs. She takes Mobic  15 mg for this that helps.   She is complaining of waking up 5 times during the night to urinate.  Gastroesophageal Reflux She complains of belching, heartburn and a hoarse voice. This is a chronic problem. The current episode started more than 1 year ago. The problem occurs frequently. The problem has been waxing and waning. The symptoms are aggravated by lying down. Pertinent negatives include no fatigue. Risk factors include obesity. She has tried a PPI for the symptoms. The treatment provided moderate relief.  Thyroid  Problem Presents for follow-up visit. Symptoms include hoarse voice. Patient reports no constipation, diarrhea, dry skin or fatigue. The symptoms have been stable.  Hyperlipidemia This is a chronic problem. The current episode started more than 1 year ago. The problem is controlled. Recent lipid tests were reviewed and are normal. Exacerbating diseases include obesity. Current antihyperlipidemic treatment includes statins. The current treatment provides moderate improvement of lipids. Risk factors for coronary artery disease include dyslipidemia, hypertension, a sedentary lifestyle and post-menopausal.      Review of Systems  Constitutional:  Negative for fatigue.  HENT:  Positive for hoarse voice.   Gastrointestinal:  Positive for heartburn. Negative for constipation and diarrhea.  All other systems reviewed and are negative.  Family History  Problem Relation Age of Onset   Seizures Mother    Congestive Heart Failure Mother    Diabetes Father    Heart disease Father    Lupus Daughter    Ulcerative colitis Son    Diabetes Sister    Cancer Brother     Thyroid  disease Sister    Prostate cancer Brother    Diabetes Brother    Diabetes Brother    Social History   Socioeconomic History   Marital status: Married    Spouse name: Ivonne   Number of children: 4   Years of education: Not on file   Highest education level: GED or equivalent  Occupational History   Occupation: Retired  Tobacco Use   Smoking status: Never   Smokeless tobacco: Never  Vaping Use   Vaping status: Never Used  Substance and Sexual Activity   Alcohol use: Never   Drug use: Never   Sexual activity: Not Currently  Other Topics Concern   Not on file  Social History Narrative   Lives in a one level home with her husband   Children live in [place], [place], [place], and [place]   Social Drivers of Health   Financial Resource Strain: Low Risk  (03/04/2023)   Overall Financial Resource Strain (CARDIA)    Difficulty of Paying Living Expenses: Not hard at all  Food Insecurity: No Food Insecurity (03/04/2023)   Hunger Vital Sign    Worried About Running Out of Food in the Last Year: Never true    Ran Out of Food in the Last Year: Never true  Transportation Needs: No Transportation Needs (03/04/2023)   PRAPARE - Administrator, Civil Service (Medical): No    Lack of Transportation (Non-Medical): No  Physical Activity: Insufficiently Active (03/04/2023)   Exercise Vital Sign    Days of Exercise per Week: 3  days    Minutes of Exercise per Session: 30 min  Stress: No Stress Concern Present (03/04/2023)   [employer] of Occupational Health - Occupational Stress Questionnaire    Feeling of Stress : Not at all  Social Connections: Moderately Integrated (03/04/2023)   Social Connection and Isolation Panel    Frequency of Communication with Friends and Family: More than three times a week    Frequency of Social Gatherings with Friends and Family: More than three times a week    Attends Religious Services: More than 4 times per year    Active Member  of [employer] or Organizations: No    Attends Banker Meetings: Never    Marital Status: Married       Objective:   Physical Exam Vitals reviewed.  Constitutional:      General: She is not in acute distress.    Appearance: She is well-developed. She is obese.  HENT:     Head: Normocephalic and atraumatic.     Right Ear: Tympanic membrane normal.     Left Ear: Tympanic membrane normal.  Eyes:     Pupils: Pupils are equal, round, and reactive to light.  Neck:     Thyroid : No thyromegaly.  Cardiovascular:     Rate and Rhythm: Normal rate and regular rhythm.     Heart sounds: Normal heart sounds. No murmur heard. Pulmonary:     Effort: Pulmonary effort is normal. No respiratory distress.     Breath sounds: Normal breath sounds. No wheezing.  Abdominal:     General: Bowel sounds are normal. There is no distension.     Palpations: Abdomen is soft.     Tenderness: There is no abdominal tenderness.  Musculoskeletal:        General: No tenderness. Normal range of motion.     Cervical back: Normal range of motion and neck supple.     Right lower leg: Edema (trace) present.     Left lower leg: Edema (trace) present.  Skin:    General: Skin is warm and dry.  Neurological:     Mental Status: She is alert and oriented to person, place, and time.     Cranial Nerves: No cranial nerve deficit.     Deep Tendon Reflexes: Reflexes are normal and symmetric.  Psychiatric:        Behavior: Behavior normal.        Thought Content: Thought content normal.        Judgment: Judgment normal.        BP 120/76   Pulse 75   Temp (!) 97 F (36.1 C) (Temporal)   Ht 5' 4 (1.626 m)   Wt 185 lb 12.8 oz (84.3 kg)   SpO2 94%   BMI 31.89 kg/m   Assessment & Plan:   STEVANA DUFNER comes in today with chief complaint of Medical Management of Chronic Issues   Diagnosis and orders addressed:  1. Dyslipidemia - atorvastatin  (LIPITOR) 20 MG tablet; Take 1 tablet (20 mg total) by mouth  daily.  Dispense: 90 tablet; Refill: 1 - CMP14+EGFR - CBC with Differential/Platelet  2. Calcaneal spur, unspecified laterality - meloxicam  (MOBIC ) 15 MG tablet; Take 1 tablet (15 mg total) by mouth daily.  Dispense: 90 tablet; Refill: 2 - CMP14+EGFR - CBC with Differential/Platelet  3. Gastroesophageal reflux disease without esophagitis - pantoprazole  (PROTONIX ) 20 MG tablet; Take 1 tablet (20 mg total) by mouth daily.  Dispense: 90 tablet; Refill: 2 - CMP14+EGFR - CBC  with Differential/Platelet  4. Hypothyroidism, unspecified type (Primary) - CMP14+EGFR - CBC with Differential/Platelet - TSH  5. Vitamin D  deficiency - CMP14+EGFR - CBC with Differential/Platelet  6. Obesity (BMI 30-39.9) - CMP14+EGFR - CBC with Differential/Platelet  7. Overactive bladder Will start oxybutynin  Limit caffeine  - oxybutynin  (DITROPAN -XL) 5 MG 24 hr tablet; Take 1 tablet (5 mg total) by mouth at bedtime.  Dispense: 90 tablet; Refill: 2   Labs pending Will start oxybutynin  today Continue current medications  Health Maintenance reviewed Diet and exercise encouraged  Follow up plan: 6 months  Bari Learn, FNP

## 2024-08-10 NOTE — Patient Instructions (Addendum)

## 2024-08-11 LAB — CMP14+EGFR
ALT: 17 IU/L (ref 0–32)
AST: 20 IU/L (ref 0–40)
Albumin: 4.1 g/dL (ref 3.8–4.8)
Alkaline Phosphatase: 108 IU/L (ref 44–121)
BUN/Creatinine Ratio: 16 (ref 12–28)
BUN: 14 mg/dL (ref 8–27)
Bilirubin Total: 0.5 mg/dL (ref 0.0–1.2)
CO2: 22 mmol/L (ref 20–29)
Calcium: 9.5 mg/dL (ref 8.7–10.3)
Chloride: 103 mmol/L (ref 96–106)
Creatinine, Ser: 0.88 mg/dL (ref 0.57–1.00)
Globulin, Total: 2.5 g/dL (ref 1.5–4.5)
Glucose: 100 mg/dL — ABNORMAL HIGH (ref 70–99)
Potassium: 4.8 mmol/L (ref 3.5–5.2)
Sodium: 139 mmol/L (ref 134–144)
Total Protein: 6.6 g/dL (ref 6.0–8.5)
eGFR: 68 mL/min/1.73 (ref >59)

## 2024-08-11 LAB — TSH: TSH: 2.29 u[IU]/mL (ref 0.450–4.500)

## 2024-08-11 LAB — CBC WITH DIFFERENTIAL/PLATELET
Basophils Absolute: 0.1 x10E3/uL (ref 0.0–0.2)
Basos: 1 %
EOS (ABSOLUTE): 0.2 x10E3/uL (ref 0.0–0.4)
Eos: 3 %
Hematocrit: 44.8 % (ref 34.0–46.6)
Hemoglobin: 14.3 g/dL (ref 11.1–15.9)
Immature Grans (Abs): 0 x10E3/uL (ref 0.0–0.1)
Immature Granulocytes: 0 %
Lymphocytes Absolute: 2.1 x10E3/uL (ref 0.7–3.1)
Lymphs: 28 %
MCH: 30 pg (ref 26.6–33.0)
MCHC: 31.9 g/dL (ref 31.5–35.7)
MCV: 94 fL (ref 79–97)
Monocytes Absolute: 0.5 x10E3/uL (ref 0.1–0.9)
Monocytes: 7 %
Neutrophils Absolute: 4.6 x10E3/uL (ref 1.4–7.0)
Neutrophils: 61 %
Platelets: 219 x10E3/uL (ref 150–450)
RBC: 4.76 x10E6/uL (ref 3.77–5.28)
RDW: 12.8 % (ref 11.7–15.4)
WBC: 7.5 x10E3/uL (ref 3.4–10.8)

## 2024-08-12 ENCOUNTER — Ambulatory Visit: Payer: Self-pay | Admitting: Family

## 2024-09-17 ENCOUNTER — Other Ambulatory Visit: Payer: Self-pay | Admitting: Family

## 2024-09-17 DIAGNOSIS — E039 Hypothyroidism, unspecified: Secondary | ICD-10-CM

## 2024-10-03 DIAGNOSIS — I77810 Thoracic aortic ectasia: Secondary | ICD-10-CM | POA: Diagnosis not present

## 2024-10-03 DIAGNOSIS — R600 Localized edema: Secondary | ICD-10-CM | POA: Diagnosis not present

## 2024-10-03 DIAGNOSIS — M7989 Other specified soft tissue disorders: Secondary | ICD-10-CM | POA: Diagnosis not present

## 2024-10-03 DIAGNOSIS — I809 Phlebitis and thrombophlebitis of unspecified site: Secondary | ICD-10-CM | POA: Diagnosis not present

## 2024-10-03 DIAGNOSIS — R079 Chest pain, unspecified: Secondary | ICD-10-CM | POA: Diagnosis not present

## 2024-10-03 LAB — LAB REPORT - SCANNED: EGFR: 89

## 2024-10-04 DIAGNOSIS — R600 Localized edema: Secondary | ICD-10-CM | POA: Diagnosis not present

## 2024-10-04 DIAGNOSIS — M7989 Other specified soft tissue disorders: Secondary | ICD-10-CM | POA: Diagnosis not present

## 2024-11-03 DIAGNOSIS — R112 Nausea with vomiting, unspecified: Secondary | ICD-10-CM | POA: Diagnosis not present

## 2024-11-03 DIAGNOSIS — R051 Acute cough: Secondary | ICD-10-CM | POA: Diagnosis not present

## 2024-11-03 DIAGNOSIS — R519 Headache, unspecified: Secondary | ICD-10-CM | POA: Diagnosis not present

## 2024-11-03 DIAGNOSIS — R070 Pain in throat: Secondary | ICD-10-CM | POA: Diagnosis not present

## 2024-11-03 DIAGNOSIS — U071 COVID-19: Secondary | ICD-10-CM | POA: Diagnosis not present

## 2025-02-10 ENCOUNTER — Ambulatory Visit: Payer: Self-pay | Admitting: Family
